# Patient Record
Sex: Female | Born: 1980 | Race: White | Hispanic: No | Marital: Married | Smoking: Never smoker
Health system: Southern US, Community
[De-identification: ages and names within clinical notes are randomized; demographics above are authoritative.]

## PROBLEM LIST (undated history)

## (undated) DIAGNOSIS — B0089 Other herpesviral infection: Secondary | ICD-10-CM

## (undated) DIAGNOSIS — E282 Polycystic ovarian syndrome: Secondary | ICD-10-CM

## (undated) DIAGNOSIS — F329 Major depressive disorder, single episode, unspecified: Secondary | ICD-10-CM

## (undated) DIAGNOSIS — F419 Anxiety disorder, unspecified: Secondary | ICD-10-CM

## (undated) DIAGNOSIS — A749 Chlamydial infection, unspecified: Secondary | ICD-10-CM

## (undated) DIAGNOSIS — K449 Diaphragmatic hernia without obstruction or gangrene: Secondary | ICD-10-CM

## (undated) DIAGNOSIS — L680 Hirsutism: Secondary | ICD-10-CM

## (undated) HISTORY — PX: COLPOSCOPY: SHX161

## (undated) HISTORY — DX: Polycystic ovarian syndrome: E28.2

## (undated) HISTORY — DX: Diaphragmatic hernia without obstruction or gangrene: K44.9

## (undated) HISTORY — PX: CHOLECYSTECTOMY, LAPAROSCOPIC: SHX56

## (undated) HISTORY — DX: Major depressive disorder, single episode, unspecified: F32.9

## (undated) HISTORY — DX: Hirsutism: L68.0

## (undated) HISTORY — DX: Anxiety disorder, unspecified: F41.9

## (undated) HISTORY — DX: Chlamydial infection, unspecified: A74.9

## (undated) HISTORY — PX: BARIATRIC SURGERY: SHX1103

## (undated) HISTORY — DX: Other herpesviral infection: B00.89

---

## 2005-04-05 ENCOUNTER — Emergency Department: Payer: Self-pay | Admitting: Emergency Medicine

## 2005-06-12 ENCOUNTER — Ambulatory Visit: Payer: Self-pay | Admitting: Family Medicine

## 2005-06-25 ENCOUNTER — Ambulatory Visit: Payer: Self-pay | Admitting: Family Medicine

## 2007-06-26 ENCOUNTER — Ambulatory Visit: Payer: Self-pay | Admitting: Surgery

## 2011-11-20 ENCOUNTER — Ambulatory Visit: Payer: Self-pay | Admitting: Nurse Practitioner

## 2011-12-09 ENCOUNTER — Ambulatory Visit: Payer: Self-pay | Admitting: Nurse Practitioner

## 2012-07-21 ENCOUNTER — Other Ambulatory Visit: Payer: Self-pay | Admitting: Bariatrics

## 2012-07-21 LAB — HEMOGLOBIN A1C: Hemoglobin A1C: 4.9 % (ref 4.2–6.3)

## 2012-07-21 LAB — FERRITIN: Ferritin (ARMC): 101 ng/mL (ref 8–388)

## 2012-07-21 LAB — MAGNESIUM: Magnesium: 1.8 mg/dL

## 2012-07-21 LAB — CBC WITH DIFFERENTIAL/PLATELET
Basophil #: 0 10*3/uL (ref 0.0–0.1)
Basophil %: 0.3 %
Eosinophil #: 0.2 10*3/uL (ref 0.0–0.7)
HCT: 38.9 % (ref 35.0–47.0)
HGB: 13.6 g/dL (ref 12.0–16.0)
Lymphocyte #: 2.4 10*3/uL (ref 1.0–3.6)
MCH: 33.6 pg (ref 26.0–34.0)
MCHC: 34.9 g/dL (ref 32.0–36.0)
MCV: 96 fL (ref 80–100)
Monocyte #: 0.3 x10 3/mm (ref 0.2–0.9)
Monocyte %: 2.9 %
Neutrophil #: 8.8 10*3/uL — ABNORMAL HIGH (ref 1.4–6.5)

## 2012-07-21 LAB — COMPREHENSIVE METABOLIC PANEL
Albumin: 3.6 g/dL (ref 3.4–5.0)
Anion Gap: 4 — ABNORMAL LOW (ref 7–16)
Bilirubin,Total: 0.4 mg/dL (ref 0.2–1.0)
Calcium, Total: 9.1 mg/dL (ref 8.5–10.1)
Chloride: 107 mmol/L (ref 98–107)
Co2: 28 mmol/L (ref 21–32)
Creatinine: 0.76 mg/dL (ref 0.60–1.30)
EGFR (African American): >60
EGFR (Non-African Amer.): >60
Glucose: 91 mg/dL (ref 65–99)
Osmolality: 275 (ref 275–301)
Potassium: 4.3 mmol/L (ref 3.5–5.1)
Sodium: 139 mmol/L (ref 136–145)

## 2012-07-21 LAB — IRON AND TIBC
Iron Bind.Cap.(Total): 351 ug/dL (ref 250–450)
Iron Saturation: 15 %
Iron: 52 ug/dL (ref 50–170)

## 2012-07-21 LAB — PHOSPHORUS: Phosphorus: 2.9 mg/dL (ref 2.5–4.9)

## 2012-07-21 LAB — APTT: Activated PTT: 28.6 secs (ref 23.6–35.9)

## 2012-07-21 LAB — PROTIME-INR: INR: 0.9

## 2012-07-21 LAB — LIPASE, BLOOD: Lipase: 125 U/L (ref 73–393)

## 2012-07-21 LAB — HEPATIC FUNCTION PANEL A (ARMC): Bilirubin, Direct: <0.1 mg/dL (ref 0.00–0.20)

## 2012-07-21 LAB — AMYLASE: Amylase: 36 U/L (ref 25–115)

## 2012-07-22 ENCOUNTER — Ambulatory Visit: Payer: Self-pay | Admitting: Bariatrics

## 2012-07-22 DIAGNOSIS — Z01810 Encounter for preprocedural cardiovascular examination: Secondary | ICD-10-CM

## 2012-08-09 ENCOUNTER — Ambulatory Visit: Payer: Self-pay | Admitting: Bariatrics

## 2012-08-14 ENCOUNTER — Ambulatory Visit: Payer: Self-pay | Admitting: Bariatrics

## 2012-12-09 DIAGNOSIS — A749 Chlamydial infection, unspecified: Secondary | ICD-10-CM

## 2012-12-09 HISTORY — DX: Chlamydial infection, unspecified: A74.9

## 2014-05-29 LAB — HM PAP SMEAR: HM Pap smear: NEGATIVE

## 2014-07-11 IMAGING — CR DG CHEST 2V
1 series · 2 of 2 positions shown · non-contrast
Comparison: none

REASON FOR EXAM: polycystic ovarian syndrom morbid obesity anxiety
COMMENTS:

[Series 1: pa · 0.17mm/px · 2 of 2 slices shown]
[im 1/2]
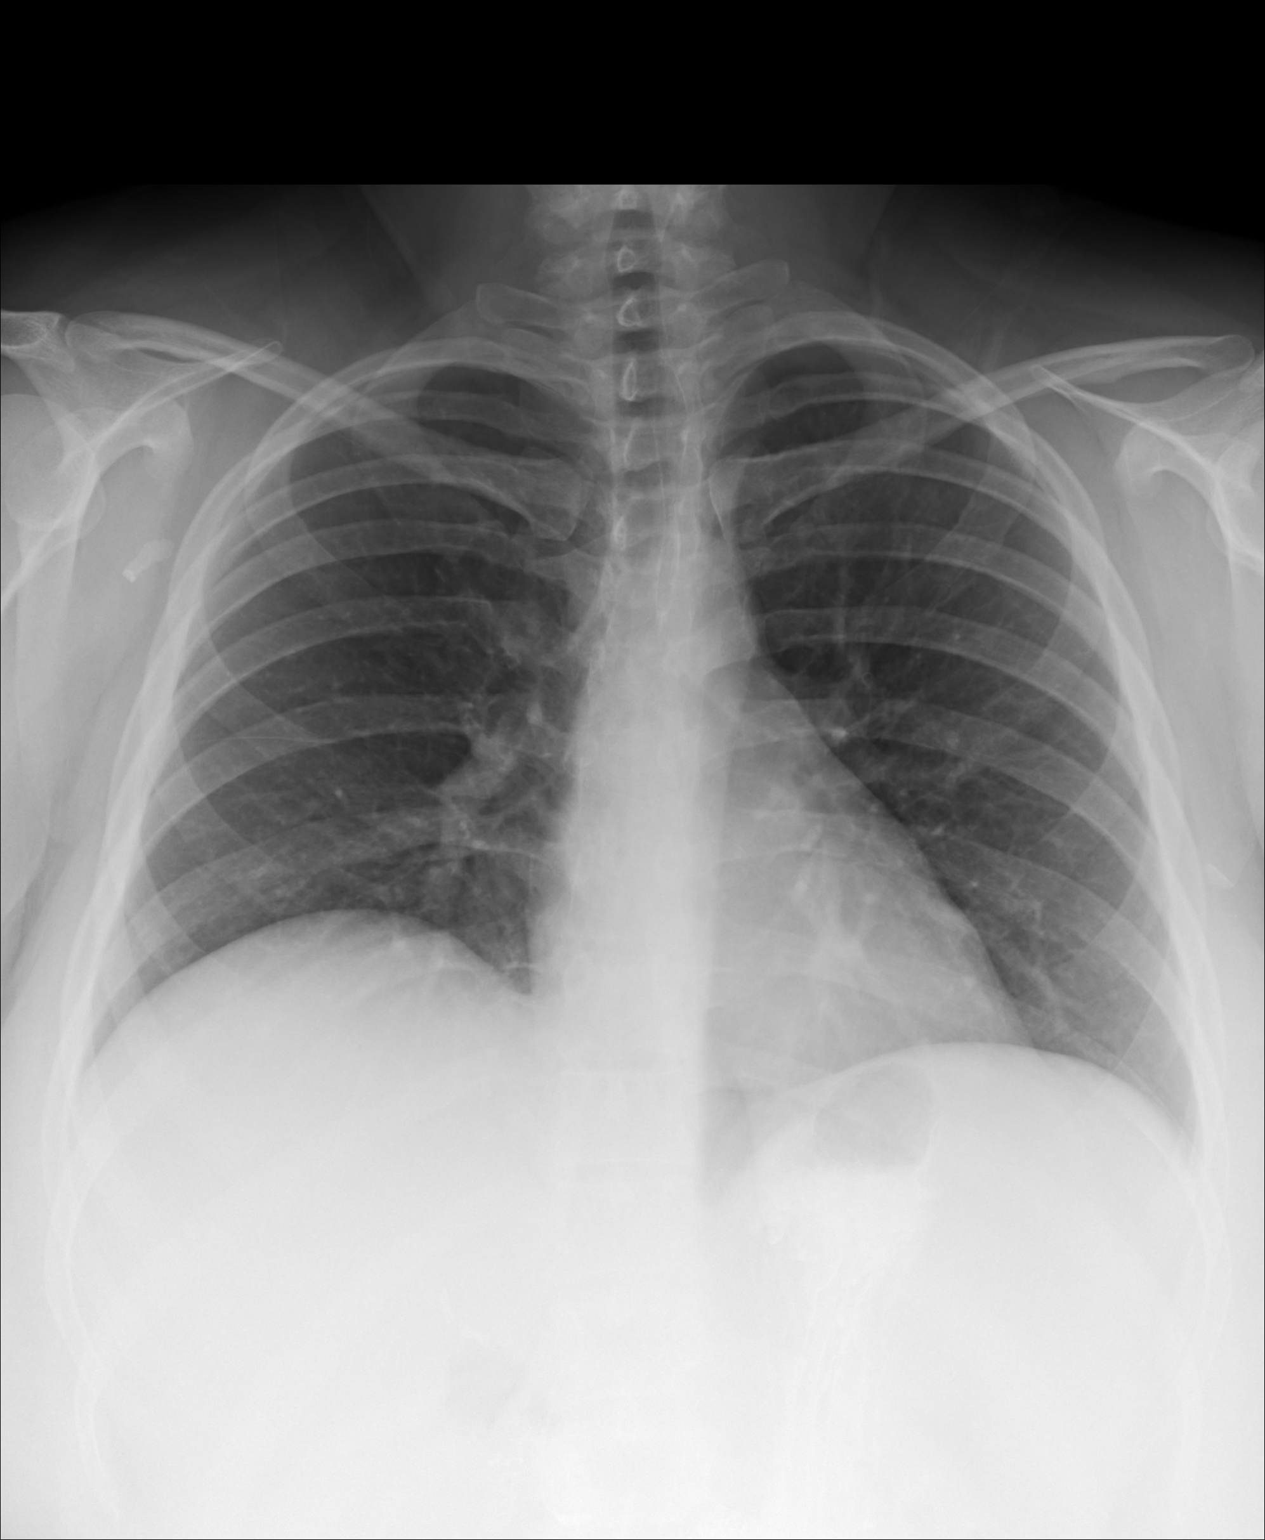
[im 2/2]
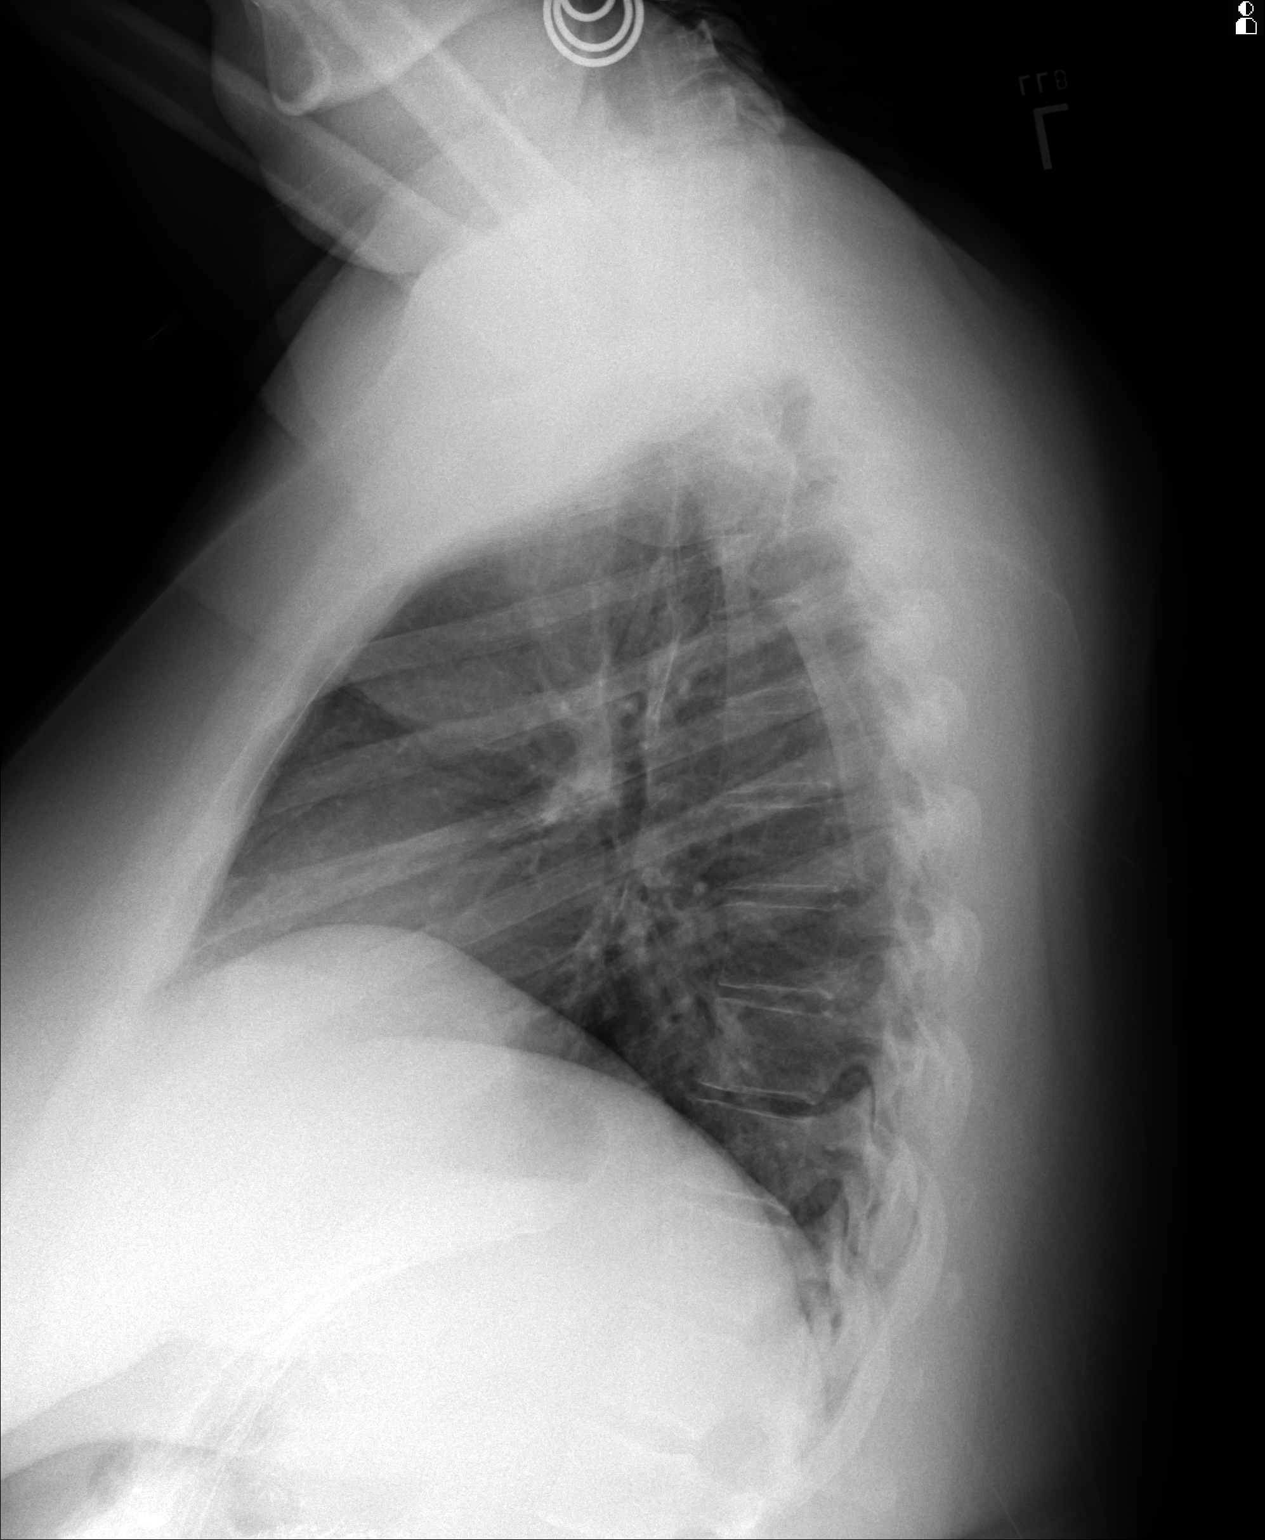

[2 of 2 positions shown; findings below may reference images not displayed]

PROCEDURE:     DXR - DXR CHEST PA (OR AP) AND LATERAL  - July 22, 2012  [DATE]

RESULT:     There is no previous exam for comparison.

The lungs are clear. The heart and pulmonary vessels are normal. The bony
and mediastinal structures are unremarkable. There is no effusion. There is
no pneumothorax or evidence of congestive failure.
IMPRESSION: No acute cardiopulmonary disease.

[REDACTED]

## 2017-09-17 ENCOUNTER — Ambulatory Visit: Payer: Self-pay | Admitting: Obstetrics and Gynecology

## 2017-10-21 ENCOUNTER — Encounter: Payer: Self-pay | Admitting: Obstetrics and Gynecology

## 2017-10-22 NOTE — Progress Notes (Signed)
This encounter was created in error - please disregard.

## 2017-11-11 ENCOUNTER — Other Ambulatory Visit: Payer: Self-pay

## 2017-11-11 ENCOUNTER — Telehealth: Payer: Self-pay

## 2017-11-11 ENCOUNTER — Other Ambulatory Visit: Payer: Self-pay | Admitting: Obstetrics and Gynecology

## 2017-11-11 MED ORDER — LEVONORGEST-ETH ESTRAD 91-DAY 0.15-0.03 MG PO TABS: 1.0000 | ORAL_TABLET | Freq: Every day | ORAL | 1 refills | Status: DC

## 2017-11-11 NOTE — Telephone Encounter (Signed)
Pt requesting refill of OCP. Her AE is 12/22/17. [phone]#[phone]

## 2017-11-11 NOTE — Telephone Encounter (Signed)
Done, pt aware.

## 2017-12-22 ENCOUNTER — Ambulatory Visit (INDEPENDENT_AMBULATORY_CARE_PROVIDER_SITE_OTHER): Payer: Commercial Managed Care - PPO | Admitting: Obstetrics and Gynecology

## 2017-12-22 ENCOUNTER — Ambulatory Visit: Payer: Self-pay | Admitting: Obstetrics and Gynecology

## 2017-12-22 ENCOUNTER — Encounter: Payer: Self-pay | Admitting: Obstetrics and Gynecology

## 2017-12-22 VITALS — BP 130/90 | HR 96 | Ht 64.0 in | Wt 190.0 lb

## 2017-12-22 DIAGNOSIS — Z01419 Encounter for gynecological examination (general) (routine) without abnormal findings: Secondary | ICD-10-CM

## 2017-12-22 DIAGNOSIS — F32A Depression, unspecified: Secondary | ICD-10-CM

## 2017-12-22 DIAGNOSIS — Z124 Encounter for screening for malignant neoplasm of cervix: Secondary | ICD-10-CM | POA: Diagnosis not present

## 2017-12-22 DIAGNOSIS — Z1329 Encounter for screening for other suspected endocrine disorder: Secondary | ICD-10-CM | POA: Diagnosis not present

## 2017-12-22 DIAGNOSIS — F419 Anxiety disorder, unspecified: Secondary | ICD-10-CM

## 2017-12-22 DIAGNOSIS — Z3041 Encounter for surveillance of contraceptive pills: Secondary | ICD-10-CM | POA: Diagnosis not present

## 2017-12-22 DIAGNOSIS — Z1321 Encounter for screening for nutritional disorder: Secondary | ICD-10-CM | POA: Diagnosis not present

## 2017-12-22 DIAGNOSIS — B0089 Other herpesviral infection: Secondary | ICD-10-CM | POA: Diagnosis not present

## 2017-12-22 DIAGNOSIS — Z113 Encounter for screening for infections with a predominantly sexual mode of transmission: Secondary | ICD-10-CM | POA: Diagnosis not present

## 2017-12-22 DIAGNOSIS — Z0000 Encounter for general adult medical examination without abnormal findings: Secondary | ICD-10-CM

## 2017-12-22 DIAGNOSIS — B009 Herpesviral infection, unspecified: Secondary | ICD-10-CM | POA: Insufficient documentation

## 2017-12-22 DIAGNOSIS — Z131 Encounter for screening for diabetes mellitus: Secondary | ICD-10-CM | POA: Diagnosis not present

## 2017-12-22 DIAGNOSIS — Z13220 Encounter for screening for lipoid disorders: Secondary | ICD-10-CM | POA: Diagnosis not present

## 2017-12-22 DIAGNOSIS — Z1151 Encounter for screening for human papillomavirus (HPV): Secondary | ICD-10-CM | POA: Diagnosis not present

## 2017-12-22 DIAGNOSIS — F329 Major depressive disorder, single episode, unspecified: Secondary | ICD-10-CM

## 2017-12-22 MED ORDER — LEVONORGEST-ETH ESTRAD 91-DAY 0.15-0.03 &0.01 MG PO TABS: 1.0000 | ORAL_TABLET | Freq: Every day | ORAL | 3 refills | Status: DC

## 2017-12-22 MED ORDER — VALACYCLOVIR HCL 1 G PO TABS: 500.0000 mg | ORAL_TABLET | Freq: Two times a day (BID) | ORAL | 0 refills | Status: DC

## 2017-12-22 NOTE — Progress Notes (Signed)
PCP:  Patient, No Pcp Per   Chief Complaint  Patient presents with  . Gynecologic Exam    std testing c blood work; ? time for pap     HPI:      [name] is a 37 y.o. No obstetric history on file. who LMP was Patient's last menstrual period was 11/27/2017., presents today for her annual examination.  Her menses are Q3 months on OCPs, lasting 5 days.  Dysmenorrhea none. She does not have intermenstrual bleeding.  Sex activity: not sexually active. Was active about 8 months ago. Wants full STD panel.  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  There is no FH of breast cancer. There is no FH of ovarian cancer. The patient does do self-breast exams.  Tobacco use: The patient denies current or previous tobacco use. Alcohol use: none No drug use.  Exercise: not active  She does get adequate calcium and Vitamin D in her diet.  Had normal labs 9/17. Due again this yr. S/p bariatric surgery 2013.  Hx of anxiety/depression. Seeing psych and therapist now due to family trauma.    Past Medical History:  Diagnosis Date  . Anxiety   . Chlamydia 2014  . Female hirsutism   . 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 Major depression   . Polycystic ovaries     Past Surgical History:  Procedure Laterality Date  . BARIATRIC SURGERY    . CHOLECYSTECTOMY, LAPAROSCOPIC    . COLPOSCOPY      Family History  Problem Relation Age of Onset  . Diabetes Mother   . Hypertension Mother   . Uterine cancer Mother   . Heart disease Father   . Diabetes Father   . Hypertension Father     Social History   Socioeconomic History  . Marital status: Single    Spouse name: Not on file  . Number of children: Not on file  . Years of education: Not on file  . Highest education level: Not on file  Social Needs  . Financial resource strain: Not on  file  . Food insecurity - worry: Not on file  . Food insecurity - inability: Not on file  . Transportation needs - medical: Not on file  . Transportation needs - non-medical: Not on file  Occupational History  . Not on file  Tobacco Use  . Smoking status: Never Smoker  . Smokeless tobacco: Never Used  Substance and Sexual Activity  . Alcohol use: No    Frequency: Never  . Drug use: No  . Sexual activity: Yes    Birth control/protection: Pill  Other Topics Concern  . Not on file  Social History Narrative  . Not on file    Current Meds  Medication Sig  . Biotin 5000 MCG SUBL Place 2 tablets under the tongue daily.  Marland Kitchen. buPROPion (WELLBUTRIN XL) 150 MG 24 hr tablet Take 1 tablet by mouth daily.  Marland Kitchen. buPROPion (WELLBUTRIN XL) 300 MG 24 hr tablet Take 1 tablet by mouth daily.  . cetirizine (ZYRTEC) 10 MG tablet Take 1 tablet by mouth daily.  Marland Kitchen. desvenlafaxine (PRISTIQ) 100 MG 24 hr tablet Take 100 mg by mouth daily.  . Levonorgestrel-Ethinyl Estradiol (ASHLYNA) 0.15-0.03 &0.01 MG tablet Take 1 tablet by mouth daily.  Marland Kitchen. LORazepam (ATIVAN) 0.5 MG tablet  Take 1 tablet by mouth daily as needed.  . Multiple Vitamins-Minerals (MULTIVITAMIN ADULT EXTRA C PO) Take 1 tablet by mouth daily.  . valACYclovir (VALTREX) 1000 MG tablet Take 0.5 tablets (500 mg total) by mouth 2 (two) times daily. X 3 days prn sx  . [DISCONTINUED] Levonorgestrel-Ethinyl Estradiol (ASHLYNA) 0.15-0.03 &0.01 MG tablet Take 1 tablet by mouth daily.  . [DISCONTINUED] valACYclovir (VALTREX) 1000 MG tablet Take 1,000 mg by mouth 3 (three) times daily as needed.     ROS:  Review of Systems  Constitutional: Positive for fatigue. Negative for fever and unexpected weight change.  Respiratory: Negative for cough, shortness of breath and wheezing.   Cardiovascular: Negative for chest pain, palpitations and leg swelling.  Gastrointestinal: Negative for blood in stool, constipation, diarrhea, nausea and vomiting.  Endocrine:  Negative for cold intolerance, heat intolerance and polyuria.  Genitourinary: Negative for dyspareunia, dysuria, flank pain, frequency, genital sores, hematuria, menstrual problem, pelvic pain, urgency, vaginal bleeding, vaginal discharge and vaginal pain.  Musculoskeletal: Negative for back pain, joint swelling and myalgias.  Skin: Negative for rash.  Neurological: Negative for dizziness, syncope, light-headedness, numbness and headaches.  Hematological: Negative for adenopathy.  Psychiatric/Behavioral: Positive for dysphoric mood. Negative for agitation, confusion, sleep disturbance and suicidal ideas. The patient is not nervous/anxious.      Objective: BP 130/90   Pulse 96   Ht 5\' 4"  (1.626 m)   Wt 190 lb (86.2 kg)   LMP 11/27/2017   BMI 32.61 kg/m    Physical Exam  Constitutional: She is oriented to person, place, and time. She appears well-developed and well-nourished.  Genitourinary: Vagina normal and uterus normal. There is no rash or tenderness on the right labia. There is no rash or tenderness on the left labia. No erythema or tenderness in the vagina. No vaginal discharge found. Right adnexum does not display mass and does not display tenderness. Left adnexum does not display mass and does not display tenderness. Cervix does not exhibit motion tenderness or polyp. Uterus is not enlarged or tender.  Neck: Normal range of motion. No thyromegaly present.  Cardiovascular: Normal rate, regular rhythm and normal heart sounds.  No murmur heard. Pulmonary/Chest: Effort normal and breath sounds normal. Right breast exhibits no mass, no nipple discharge, no skin change and no tenderness. Left breast exhibits no mass, no nipple discharge, no skin change and no tenderness.  Abdominal: Soft. There is no tenderness. There is no guarding.  Musculoskeletal: Normal range of motion.  Neurological: She is alert and oriented to person, place, and time. No cranial nerve deficit.  Psychiatric: She  has a normal mood and affect. Her behavior is normal.  Vitals reviewed.  Assessment/Plan: Encounter for annual routine gynecological examination  Cervical cancer screening - Plan: IGP,CtNgTv,Apt HPV,rfx16/18,45, CANCELED: IGP, Aptima HPV  Screening for HPV (human papillomavirus) - Plan: IGP,CtNgTv,Apt HPV,rfx16/18,45, CANCELED: IGP, Aptima HPV  Screening for STD (sexually transmitted disease) - Plan: RPR, HIV antibody, Hepatitis C antibody, IGP,CtNgTv,Apt HPV,rfx16/18,45  Encounter for surveillance of contraceptive pills - OCP RF. - Plan: Levonorgestrel-Ethinyl Estradiol (ASHLYNA) 0.15-0.03 &0.01 MG tablet  Anxiety and depression - Followed by psych now. Seeing therapist, too.  Herpetic whitlow - On hands. Rx RF valtrex prn. - Plan: valACYclovir (VALTREX) 1000 MG tablet  Blood tests for routine general physical examination - Plan: Comprehensive metabolic panel, CBC with Differential/Platelet, B12 and Folate Panel, TSH, VITAMIN D 25 Hydroxy (Vit-D Deficiency, Fractures), Lipid panel, Hemoglobin A1c  Encounter for vitamin deficiency screening - Plan: B12 and Folate  Panel, VITAMIN D 25 Hydroxy (Vit-D Deficiency, Fractures)  Screening cholesterol level - Plan: Lipid panel  Thyroid disorder screening - Plan: TSH  Screening for diabetes mellitus - Plan: Hemoglobin A1c  Meds ordered this encounter  Medications  . valACYclovir (VALTREX) 1000 MG tablet    Sig: Take 0.5 tablets (500 mg total) by mouth 2 (two) times daily. X 3 days prn sx    Dispense:  30 tablet    Refill:  0  . Levonorgestrel-Ethinyl Estradiol (ASHLYNA) 0.15-0.03 &0.01 MG tablet    Sig: Take 1 tablet by mouth daily.    Dispense:  1 Package    Refill:  3             GYN counsel breast self exam, adequate intake of calcium and vitamin D, diet and exercise     F/U  Return in about 1 year (around 12/22/2018).  Tahisha Hakim B. Tearra Ouk, PA-C 12/22/2017 9:42 AM

## 2017-12-22 NOTE — Patient Instructions (Signed)
I value your feedback and entrusting us with your care. If you get a [facility] patient survey, I would appreciate you taking the time to let us know about your experience today. Thank you! 

## 2017-12-23 LAB — COMPREHENSIVE METABOLIC PANEL
ALBUMIN: 4.2 g/dL (ref 3.5–5.5)
ALK PHOS: 137 IU/L — AB (ref 39–117)
ALT: 34 IU/L — AB (ref 0–32)
AST: 30 IU/L (ref 0–40)
Albumin/Globulin Ratio: 1.4 (ref 1.2–2.2)
BUN / CREAT RATIO: 7 — AB (ref 9–23)
BUN: 7 mg/dL (ref 6–20)
Bilirubin Total: 0.4 mg/dL (ref 0.0–1.2)
CHLORIDE: 101 mmol/L (ref 96–106)
CO2: 24 mmol/L (ref 20–29)
CREATININE: 0.95 mg/dL (ref 0.57–1.00)
Calcium: 9.5 mg/dL (ref 8.7–10.2)
GFR calc Af Amer: 90 mL/min/{1.73_m2} (ref >59)
GFR calc non Af Amer: 78 mL/min/{1.73_m2} (ref >59)
GLUCOSE: 85 mg/dL (ref 65–99)
Globulin, Total: 3 g/dL (ref 1.5–4.5)
Potassium: 4 mmol/L (ref 3.5–5.2)
Sodium: 141 mmol/L (ref 134–144)
TOTAL PROTEIN: 7.2 g/dL (ref 6.0–8.5)

## 2017-12-23 LAB — CBC WITH DIFFERENTIAL/PLATELET
BASOS ABS: 0 10*3/uL (ref 0.0–0.2)
Basos: 0 %
EOS (ABSOLUTE): 0.2 10*3/uL (ref 0.0–0.4)
Eos: 2 %
HEMATOCRIT: 45.2 % (ref 34.0–46.6)
HEMOGLOBIN: 15.4 g/dL (ref 11.1–15.9)
IMMATURE GRANS (ABS): 0 10*3/uL (ref 0.0–0.1)
IMMATURE GRANULOCYTES: 0 %
LYMPHS ABS: 2 10*3/uL (ref 0.7–3.1)
LYMPHS: 21 %
MCH: 33.8 pg — ABNORMAL HIGH (ref 26.6–33.0)
MCHC: 34.1 g/dL (ref 31.5–35.7)
MCV: 99 fL — ABNORMAL HIGH (ref 79–97)
MONOCYTES: 4 %
Monocytes Absolute: 0.4 10*3/uL (ref 0.1–0.9)
Neutrophils Absolute: 7 10*3/uL (ref 1.4–7.0)
Neutrophils: 73 %
Platelets: 323 10*3/uL (ref 150–379)
RBC: 4.55 x10E6/uL (ref 3.77–5.28)
RDW: 13.3 % (ref 12.3–15.4)
WBC: 9.6 10*3/uL (ref 3.4–10.8)

## 2017-12-23 LAB — TSH: TSH: 1.41 u[IU]/mL (ref 0.450–4.500)

## 2017-12-23 LAB — HEMOGLOBIN A1C
ESTIMATED AVERAGE GLUCOSE: 91 mg/dL
Hgb A1c MFr Bld: 4.8 % (ref 4.8–5.6)

## 2017-12-23 LAB — B12 AND FOLATE PANEL: VITAMIN B 12: 678 pg/mL (ref 232–1245)

## 2017-12-23 LAB — HIV ANTIBODY (ROUTINE TESTING W REFLEX): HIV SCREEN 4TH GENERATION: NONREACTIVE

## 2017-12-23 LAB — LIPID PANEL
CHOL/HDL RATIO: 3.3 ratio (ref 0.0–4.4)
Cholesterol, Total: 182 mg/dL (ref 100–199)
HDL: 55 mg/dL (ref >39)
LDL CALC: 104 mg/dL — AB (ref 0–99)
TRIGLYCERIDES: 113 mg/dL (ref 0–149)
VLDL Cholesterol Cal: 23 mg/dL (ref 5–40)

## 2017-12-23 LAB — HEPATITIS C ANTIBODY: Hep C Virus Ab: <0.1 s/co ratio (ref 0.0–0.9)

## 2017-12-23 LAB — RPR: RPR: NONREACTIVE

## 2017-12-23 LAB — VITAMIN D 25 HYDROXY (VIT D DEFICIENCY, FRACTURES): VIT D 25 HYDROXY: 55.1 ng/mL (ref 30.0–100.0)

## 2017-12-26 LAB — IGP,CTNGTV,APT HPV,RFX16/18,45
Chlamydia, Nuc. Acid Amp: NEGATIVE
Gonococcus, Nuc. Acid Amp: NEGATIVE
HPV Aptima: POSITIVE — AB
HPV GENOTYPE 18,45: NEGATIVE
HPV Genotype 16: NEGATIVE
PAP SMEAR COMMENT: 0
TRICH VAG BY NAA: NEGATIVE

## 2017-12-31 ENCOUNTER — Telehealth: Payer: Self-pay | Admitting: Obstetrics and Gynecology

## 2017-12-31 NOTE — Telephone Encounter (Signed)
LMTRC

## 2017-12-31 NOTE — Telephone Encounter (Signed)
Pt returning your phone call

## 2018-08-17 ENCOUNTER — Telehealth: Payer: Self-pay

## 2018-08-17 ENCOUNTER — Other Ambulatory Visit: Payer: Self-pay

## 2018-08-17 DIAGNOSIS — Z3041 Encounter for surveillance of contraceptive pills: Secondary | ICD-10-CM

## 2018-08-17 MED ORDER — LEVONORGEST-ETH ESTRAD 91-DAY 0.15-0.03 &0.01 MG PO TABS: 1.0000 | ORAL_TABLET | Freq: Every day | ORAL | 3 refills | Status: DC

## 2018-08-17 NOTE — Telephone Encounter (Signed)
Pt calling stating there was a problem with [facility] transferring her 90 day supply of OCP to [facility]. Pt would like [facility] to send in refill to [facility].   I updated her pharmacy in chart. KJ CMA

## 2018-10-02 DIAGNOSIS — F331 Major depressive disorder, recurrent, moderate: Secondary | ICD-10-CM | POA: Diagnosis not present

## 2018-10-02 DIAGNOSIS — F411 Generalized anxiety disorder: Secondary | ICD-10-CM | POA: Diagnosis not present

## 2018-10-26 DIAGNOSIS — M542 Cervicalgia: Secondary | ICD-10-CM | POA: Diagnosis not present

## 2018-10-26 DIAGNOSIS — S4992XA Unspecified injury of left shoulder and upper arm, initial encounter: Secondary | ICD-10-CM | POA: Diagnosis not present

## 2018-11-19 DIAGNOSIS — F411 Generalized anxiety disorder: Secondary | ICD-10-CM | POA: Diagnosis not present

## 2018-11-19 DIAGNOSIS — F331 Major depressive disorder, recurrent, moderate: Secondary | ICD-10-CM | POA: Diagnosis not present

## 2018-11-27 DIAGNOSIS — R21 Rash and other nonspecific skin eruption: Secondary | ICD-10-CM | POA: Diagnosis not present

## 2018-11-27 DIAGNOSIS — I891 Lymphangitis: Secondary | ICD-10-CM | POA: Diagnosis not present

## 2018-12-11 DIAGNOSIS — F331 Major depressive disorder, recurrent, moderate: Secondary | ICD-10-CM | POA: Diagnosis not present

## 2018-12-11 DIAGNOSIS — F411 Generalized anxiety disorder: Secondary | ICD-10-CM | POA: Diagnosis not present

## 2018-12-23 DIAGNOSIS — F411 Generalized anxiety disorder: Secondary | ICD-10-CM | POA: Diagnosis not present

## 2018-12-23 DIAGNOSIS — F331 Major depressive disorder, recurrent, moderate: Secondary | ICD-10-CM | POA: Diagnosis not present

## 2019-01-05 DIAGNOSIS — F331 Major depressive disorder, recurrent, moderate: Secondary | ICD-10-CM | POA: Diagnosis not present

## 2019-01-05 DIAGNOSIS — F411 Generalized anxiety disorder: Secondary | ICD-10-CM | POA: Diagnosis not present

## 2019-01-15 DIAGNOSIS — F331 Major depressive disorder, recurrent, moderate: Secondary | ICD-10-CM | POA: Diagnosis not present

## 2019-01-15 DIAGNOSIS — F411 Generalized anxiety disorder: Secondary | ICD-10-CM | POA: Diagnosis not present

## 2019-01-20 DIAGNOSIS — Z9884 Bariatric surgery status: Secondary | ICD-10-CM | POA: Diagnosis not present

## 2019-01-20 DIAGNOSIS — R635 Abnormal weight gain: Secondary | ICD-10-CM | POA: Diagnosis not present

## 2019-01-20 DIAGNOSIS — Z6837 Body mass index (BMI) 37.0-37.9, adult: Secondary | ICD-10-CM | POA: Diagnosis not present

## 2019-02-04 DIAGNOSIS — F411 Generalized anxiety disorder: Secondary | ICD-10-CM | POA: Diagnosis not present

## 2019-02-04 DIAGNOSIS — F331 Major depressive disorder, recurrent, moderate: Secondary | ICD-10-CM | POA: Diagnosis not present

## 2019-02-04 DIAGNOSIS — F410 Panic disorder [episodic paroxysmal anxiety] without agoraphobia: Secondary | ICD-10-CM | POA: Diagnosis not present

## 2019-02-16 ENCOUNTER — Ambulatory Visit (INDEPENDENT_AMBULATORY_CARE_PROVIDER_SITE_OTHER): Payer: BLUE CROSS/BLUE SHIELD | Admitting: Obstetrics and Gynecology

## 2019-02-16 ENCOUNTER — Encounter: Payer: Self-pay | Admitting: Obstetrics and Gynecology

## 2019-02-16 ENCOUNTER — Other Ambulatory Visit (HOSPITAL_COMMUNITY)
Admission: RE | Admit: 2019-02-16 | Discharge: 2019-02-16 | Disposition: A | Discharge status: Home / Self Care | Payer: BLUE CROSS/BLUE SHIELD | Source: Ambulatory Visit | Attending: Obstetrics and Gynecology | Admitting: Obstetrics and Gynecology

## 2019-02-16 VITALS — BP 106/80 | HR 105 | Ht 64.0 in | Wt 218.0 lb

## 2019-02-16 DIAGNOSIS — Z124 Encounter for screening for malignant neoplasm of cervix: Secondary | ICD-10-CM | POA: Diagnosis not present

## 2019-02-16 DIAGNOSIS — Z0000 Encounter for general adult medical examination without abnormal findings: Secondary | ICD-10-CM

## 2019-02-16 DIAGNOSIS — Z01419 Encounter for gynecological examination (general) (routine) without abnormal findings: Secondary | ICD-10-CM | POA: Diagnosis not present

## 2019-02-16 DIAGNOSIS — Z13220 Encounter for screening for lipoid disorders: Secondary | ICD-10-CM

## 2019-02-16 DIAGNOSIS — Z1151 Encounter for screening for human papillomavirus (HPV): Secondary | ICD-10-CM

## 2019-02-16 DIAGNOSIS — R87810 Cervical high risk human papillomavirus (HPV) DNA test positive: Secondary | ICD-10-CM | POA: Diagnosis not present

## 2019-02-16 DIAGNOSIS — Z3041 Encounter for surveillance of contraceptive pills: Secondary | ICD-10-CM

## 2019-02-16 DIAGNOSIS — F419 Anxiety disorder, unspecified: Secondary | ICD-10-CM

## 2019-02-16 DIAGNOSIS — F32A Depression, unspecified: Secondary | ICD-10-CM

## 2019-02-16 DIAGNOSIS — F329 Major depressive disorder, single episode, unspecified: Secondary | ICD-10-CM

## 2019-02-16 DIAGNOSIS — Z9884 Bariatric surgery status: Secondary | ICD-10-CM

## 2019-02-16 DIAGNOSIS — Z113 Encounter for screening for infections with a predominantly sexual mode of transmission: Secondary | ICD-10-CM

## 2019-02-16 DIAGNOSIS — Z1321 Encounter for screening for nutritional disorder: Secondary | ICD-10-CM

## 2019-02-16 DIAGNOSIS — Z1329 Encounter for screening for other suspected endocrine disorder: Secondary | ICD-10-CM

## 2019-02-16 DIAGNOSIS — B0089 Other herpesviral infection: Secondary | ICD-10-CM

## 2019-02-16 DIAGNOSIS — Z3169 Encounter for other general counseling and advice on procreation: Secondary | ICD-10-CM

## 2019-02-16 DIAGNOSIS — Z131 Encounter for screening for diabetes mellitus: Secondary | ICD-10-CM

## 2019-02-16 MED ORDER — VALACYCLOVIR HCL 1 G PO TABS
500.0000 mg | ORAL_TABLET | Freq: Two times a day (BID) | ORAL | 0 refills | Status: DC
Start: 2019-02-16 — End: 2020-04-24

## 2019-02-16 MED ORDER — LEVONORGEST-ETH ESTRAD 91-DAY 0.15-0.03 &0.01 MG PO TABS
1.0000 | ORAL_TABLET | Freq: Every day | ORAL | 4 refills | Status: DC
Start: 2019-02-16 — End: 2020-04-04

## 2019-02-16 NOTE — Progress Notes (Signed)
PCP:  Patient, No Pcp Per   Chief Complaint  Patient presents with  . Gynecologic Exam     HPI:      [name] is a 38 y.o. No obstetric history on file. who LMP was Patient's last menstrual period was 11/26/2018 (exact date)., presents today for her annual examination.  Her menses are Q3 months on cont dosing OCPs, lasting 4-5 days.  Dysmenorrhea none. She does not have intermenstrual bleeding. Hx of PCOS with irreg menses prior to OCPs. Was also heavier at that time. Pt wants to conceive after wedding in a couple months. Takes multiple vits due to s/p bariatric surg.  Sex activity: currently sexually active. Contraception: OCPs. Last Pap: 12/22/17 Results were: no abnormalities /POS HPV DNA. Repeat pap due today.  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  There is no FH of breast cancer. There is no FH of ovarian cancer. The patient does do self-breast exams.  Tobacco use: The patient denies current or previous tobacco use. Alcohol use: none No drug use.  Exercise: min active  She does get adequate calcium and Vitamin D in her diet.  Had normal labs 1/19. Due again this yr. S/p bariatric surgery 2013.  Hx of anxiety/depression. Seeing psych and therapist now due to family trauma.    Past Medical History:  Diagnosis Date  . Anxiety   . Chlamydia 2014  . Female hirsutism   . 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 Major depression   . Polycystic ovaries     Past Surgical History:  Procedure Laterality Date  . BARIATRIC SURGERY    . CHOLECYSTECTOMY, LAPAROSCOPIC    . COLPOSCOPY      Family History  Problem Relation Age of Onset  . Diabetes Mother        type 2  . Hypertension Mother   . Uterine cancer Mother   . Heart disease Father   . Diabetes Father        type 2  . Hypertension Father   . Colon cancer Maternal Grandmother     Social History   Socioeconomic History  .  Marital status: Single    Spouse name: Not on file  . Number of children: Not on file  . Years of education: Not on file  . Highest education level: Not on file  Occupational History  . Not on file  Social Needs  . Financial resource strain: Not on file  . Food insecurity:    Worry: Not on file    Inability: Not on file  . Transportation needs:    Medical: Not on file    Non-medical: Not on file  Tobacco Use  . Smoking status: Never Smoker  . Smokeless tobacco: Never Used  Substance and Sexual Activity  . Alcohol use: No    Frequency: Never  . Drug use: No  . Sexual activity: Yes    Birth control/protection: Pill  Lifestyle  . Physical activity:    Days per week: Not on file    Minutes per session: Not on file  . Stress: Not on file  Relationships  . Social connections:    Talks on phone: Not on file    Gets together: Not on file    Attends religious service: Not on file    Active member of club or organization: Not on file    Attends meetings of clubs  or organizations: Not on file    Relationship status: Not on file  . Intimate partner violence:    Fear of current or ex partner: Not on file    Emotionally abused: Not on file    Physically abused: Not on file    Forced sexual activity: Not on file  Other Topics Concern  . Not on file  Social History Narrative  . Not on file    Current Meds  Medication Sig  . buPROPion (WELLBUTRIN XL) 300 MG 24 hr tablet Take 1 tablet by mouth daily.  . cetirizine (ZYRTEC) 10 MG tablet Take 1 tablet by mouth daily.  Marland Kitchen desvenlafaxine (PRISTIQ) 100 MG 24 hr tablet Take 100 mg by mouth daily.  . Levonorgestrel-Ethinyl Estradiol (ASHLYNA) 0.15-0.03 &0.01 MG tablet Take 1 tablet by mouth daily.  Marland Kitchen LORazepam (ATIVAN) 1 MG tablet Take 1/2  to 1 tablet twice daily as needed for anxiety  . Multiple Vitamins-Minerals (MULTIVITAMIN ADULT EXTRA C PO) Take 1 tablet by mouth daily.  . phentermine (ADIPEX-P) 37.5 MG tablet TAKE ONE HALF TABLET  BY MOUTH IN THE MORNING  . valACYclovir (VALTREX) 1000 MG tablet Take 0.5 tablets (500 mg total) by mouth 2 (two) times daily. X 3 days prn sx  . [DISCONTINUED] Levonorgestrel-Ethinyl Estradiol (ASHLYNA) 0.15-0.03 &0.01 MG tablet Take 1 tablet by mouth daily.  . [DISCONTINUED] valACYclovir (VALTREX) 1000 MG tablet Take 0.5 tablets (500 mg total) by mouth 2 (two) times daily. X 3 days prn sx     ROS:  Review of Systems  Constitutional: Negative for fatigue, fever and unexpected weight change.  Respiratory: Negative for cough, shortness of breath and wheezing.   Cardiovascular: Negative for chest pain, palpitations and leg swelling.  Gastrointestinal: Negative for blood in stool, constipation, diarrhea, nausea and vomiting.  Endocrine: Negative for cold intolerance, heat intolerance and polyuria.  Genitourinary: Negative for dyspareunia, dysuria, flank pain, frequency, genital sores, hematuria, menstrual problem, pelvic pain, urgency, vaginal bleeding, vaginal discharge and vaginal pain.  Musculoskeletal: Negative for back pain, joint swelling and myalgias.  Skin: Negative for rash.  Neurological: Negative for dizziness, syncope, light-headedness, numbness and headaches.  Hematological: Negative for adenopathy.  Psychiatric/Behavioral: Positive for agitation and dysphoric mood. Negative for confusion, sleep disturbance and suicidal ideas. The patient is not nervous/anxious.      Objective: BP 106/80   Pulse (!) 105   Ht 5\' 4"  (1.626 m)   Wt 218 lb (98.9 kg)   LMP 11/26/2018 (Exact Date)   BMI 37.42 kg/m    Physical Exam Constitutional:      Appearance: She is well-developed.  Genitourinary:     Vulva, vagina, uterus, right adnexa and left adnexa normal.     No vulval lesion or tenderness noted.     No vaginal discharge, erythema or tenderness.     No cervical motion tenderness or polyp.     Uterus is not enlarged or tender.     No right or left adnexal mass present.      Right adnexa not tender.     Left adnexa not tender.  Neck:     Musculoskeletal: Normal range of motion.     Thyroid: No thyromegaly.  Cardiovascular:     Rate and Rhythm: Normal rate and regular rhythm.     Heart sounds: Normal heart sounds. No murmur.  Pulmonary:     Effort: Pulmonary effort is normal.     Breath sounds: Normal breath sounds.  Chest:     Breasts:  Right: No mass, nipple discharge, skin change or tenderness.        Left: No mass, nipple discharge, skin change or tenderness.  Abdominal:     Palpations: Abdomen is soft.     Tenderness: There is no abdominal tenderness. There is no guarding.  Musculoskeletal: Normal range of motion.  Neurological:     General: No focal deficit present.     Mental Status: She is alert and oriented to person, place, and time.     Cranial Nerves: No cranial nerve deficit.  Skin:    General: Skin is warm and dry.  Psychiatric:        Mood and Affect: Mood normal.        Behavior: Behavior normal.        Thought Content: Thought content normal.        Judgment: Judgment normal.  Vitals signs reviewed.    Assessment/Plan: Encounter for annual routine gynecological examination  Cervical cancer screening - Plan: Cytology - PAP  Screening for HPV (human papillomavirus) - Plan: Cytology - PAP  Cervical high risk human papillomavirus (HPV) DNA test positive - Repeat pap today. Will call pt with results.  - Plan: Cytology - PAP  Encounter for surveillance of contraceptive pills - OCP RF - Plan: Levonorgestrel-Ethinyl Estradiol (ASHLYNA) 0.15-0.03 &0.01 MG tablet  Pre-conception counseling - D/C OCPs. Hx of PCOS. Follow cycles for 6 months. F/u if not regular or no conception. Cont vits. Need to change depression meds, d/c phentermine.  Herpetic whitlow - On hands. Rx RF valtrex prn. - Plan: valACYclovir (VALTREX) 1000 MG tablet  Anxiety and depression - Followed by psych. Meds need changed for conception.   Blood tests for  routine general physical examination - Plan: Comprehensive metabolic panel, CBC with Differential/Platelet, B12 and Folate Panel, TSH + free T4, VITAMIN D 25 Hydroxy (Vit-D Deficiency, Fractures), Lipid panel, Hemoglobin A1c  Encounter for vitamin deficiency screening - Plan: B12 and Folate Panel, VITAMIN D 25 Hydroxy (Vit-D Deficiency, Fractures)  Screening cholesterol level - Plan: Lipid panel  Thyroid disorder screening - Plan: TSH + free T4  Screening for diabetes mellitus - Plan: Hemoglobin A1c  H/O bariatric surgery - Plan: Comprehensive metabolic panel, CBC with Differential/Platelet, B12 and Folate Panel, TSH + free T4, VITAMIN D 25 Hydroxy (Vit-D Deficiency, Fractures), Lipid panel, Hemoglobin A1c  Meds ordered this encounter  Medications  . Levonorgestrel-Ethinyl Estradiol (ASHLYNA) 0.15-0.03 &0.01 MG tablet    Sig: Take 1 tablet by mouth daily.    Dispense:  3 Package    Refill:  4    Order Specific Question:   Supervising Provider    Answer:   Nadara Mustard [id number]  . valACYclovir (VALTREX) 1000 MG tablet    Sig: Take 0.5 tablets (500 mg total) by mouth 2 (two) times daily. X 3 days prn sx    Dispense:  30 tablet    Refill:  0    Order Specific Question:   Supervising Provider    Answer:   Nadara Mustard [[id number]]             GYN counsel breast self exam, adequate intake of calcium and vitamin D, diet and exercise     F/U  Return in about 1 year (around 02/16/2020).  Davan Hark B. Cheyenne Bordeaux, PA-C 02/16/2019 3:57 PM

## 2019-02-17 DIAGNOSIS — Z6836 Body mass index (BMI) 36.0-36.9, adult: Secondary | ICD-10-CM | POA: Diagnosis not present

## 2019-02-17 DIAGNOSIS — Z9884 Bariatric surgery status: Secondary | ICD-10-CM | POA: Diagnosis not present

## 2019-02-18 ENCOUNTER — Other Ambulatory Visit: Payer: BLUE CROSS/BLUE SHIELD

## 2019-02-18 DIAGNOSIS — Z113 Encounter for screening for infections with a predominantly sexual mode of transmission: Secondary | ICD-10-CM

## 2019-02-18 DIAGNOSIS — Z13220 Encounter for screening for lipoid disorders: Secondary | ICD-10-CM

## 2019-02-18 DIAGNOSIS — Z131 Encounter for screening for diabetes mellitus: Secondary | ICD-10-CM | POA: Diagnosis not present

## 2019-02-18 DIAGNOSIS — Z0000 Encounter for general adult medical examination without abnormal findings: Secondary | ICD-10-CM

## 2019-02-18 DIAGNOSIS — Z1329 Encounter for screening for other suspected endocrine disorder: Secondary | ICD-10-CM

## 2019-02-18 DIAGNOSIS — Z1321 Encounter for screening for nutritional disorder: Secondary | ICD-10-CM

## 2019-02-18 DIAGNOSIS — Z9884 Bariatric surgery status: Secondary | ICD-10-CM | POA: Diagnosis not present

## 2019-02-18 LAB — CYTOLOGY - PAP
DIAGNOSIS: NEGATIVE
HPV: DETECTED — AB

## 2019-02-18 NOTE — Addendum Note (Signed)
Addended by: Althea Grimmer B on: 02/18/2019 11:18 AM   Modules accepted: Orders

## 2019-02-19 LAB — CBC WITH DIFFERENTIAL/PLATELET
Basophils Absolute: 0 10*3/uL (ref 0.0–0.2)
Basos: 0 %
EOS (ABSOLUTE): 0.2 10*3/uL (ref 0.0–0.4)
Eos: 2 %
Hematocrit: 43.4 % (ref 34.0–46.6)
Hemoglobin: 14.5 g/dL (ref 11.1–15.9)
Immature Grans (Abs): 0 10*3/uL (ref 0.0–0.1)
Immature Granulocytes: 0 %
Lymphocytes Absolute: 3 10*3/uL (ref 0.7–3.1)
Lymphs: 32 %
MCH: 33 pg (ref 26.6–33.0)
MCHC: 33.4 g/dL (ref 31.5–35.7)
MCV: 99 fL — ABNORMAL HIGH (ref 79–97)
MONOS ABS: 0.4 10*3/uL (ref 0.1–0.9)
Monocytes: 4 %
NEUTROS PCT: 62 %
Neutrophils Absolute: 6 10*3/uL (ref 1.4–7.0)
Platelets: 336 10*3/uL (ref 150–450)
RBC: 4.4 x10E6/uL (ref 3.77–5.28)
RDW: 12.8 % (ref 11.7–15.4)
WBC: 9.6 10*3/uL (ref 3.4–10.8)

## 2019-02-19 LAB — COMPREHENSIVE METABOLIC PANEL
ALT: 33 IU/L — ABNORMAL HIGH (ref 0–32)
AST: 23 IU/L (ref 0–40)
Albumin/Globulin Ratio: 1.4 (ref 1.2–2.2)
Albumin: 4 g/dL (ref 3.8–4.8)
Alkaline Phosphatase: 145 IU/L — ABNORMAL HIGH (ref 39–117)
BUN/Creatinine Ratio: 10 (ref 9–23)
BUN: 8 mg/dL (ref 6–20)
Bilirubin Total: 0.2 mg/dL (ref 0.0–1.2)
CHLORIDE: 106 mmol/L (ref 96–106)
CO2: 22 mmol/L (ref 20–29)
Calcium: 9.4 mg/dL (ref 8.7–10.2)
Creatinine, Ser: 0.81 mg/dL (ref 0.57–1.00)
GFR calc Af Amer: 108 mL/min/{1.73_m2} (ref >59)
GFR calc non Af Amer: 94 mL/min/{1.73_m2} (ref >59)
GLUCOSE: 81 mg/dL (ref 65–99)
Globulin, Total: 2.8 g/dL (ref 1.5–4.5)
Potassium: 4.4 mmol/L (ref 3.5–5.2)
Sodium: 141 mmol/L (ref 134–144)
Total Protein: 6.8 g/dL (ref 6.0–8.5)

## 2019-02-19 LAB — HEMOGLOBIN A1C
Est. average glucose Bld gHb Est-mCnc: 94 mg/dL
HEMOGLOBIN A1C: 4.9 % (ref 4.8–5.6)

## 2019-02-19 LAB — RPR: RPR Ser Ql: NONREACTIVE

## 2019-02-19 LAB — LIPID PANEL
CHOL/HDL RATIO: 3.5 ratio (ref 0.0–4.4)
Cholesterol, Total: 159 mg/dL (ref 100–199)
HDL: 45 mg/dL (ref >39)
LDL Calculated: 90 mg/dL (ref 0–99)
TRIGLYCERIDES: 120 mg/dL (ref 0–149)
VLDL Cholesterol Cal: 24 mg/dL (ref 5–40)

## 2019-02-19 LAB — B12 AND FOLATE PANEL
Folate: 17.2 ng/mL (ref >3.0)
Vitamin B-12: 561 pg/mL (ref 232–1245)

## 2019-02-19 LAB — TSH+FREE T4
FREE T4: 0.95 ng/dL (ref 0.82–1.77)
TSH: 2.69 u[IU]/mL (ref 0.450–4.500)

## 2019-02-19 LAB — VITAMIN D 25 HYDROXY (VIT D DEFICIENCY, FRACTURES): Vit D, 25-Hydroxy: 63.1 ng/mL (ref 30.0–100.0)

## 2019-02-19 LAB — HEPATITIS C ANTIBODY: Hep C Virus Ab: <0.1 s/co ratio (ref 0.0–0.9)

## 2019-02-19 LAB — HIV ANTIBODY (ROUTINE TESTING W REFLEX): HIV Screen 4th Generation wRfx: NONREACTIVE

## 2019-03-08 ENCOUNTER — Ambulatory Visit: Payer: BLUE CROSS/BLUE SHIELD | Admitting: Obstetrics and Gynecology

## 2019-03-09 DIAGNOSIS — F331 Major depressive disorder, recurrent, moderate: Secondary | ICD-10-CM | POA: Diagnosis not present

## 2019-03-09 DIAGNOSIS — F410 Panic disorder [episodic paroxysmal anxiety] without agoraphobia: Secondary | ICD-10-CM | POA: Diagnosis not present

## 2019-03-09 DIAGNOSIS — F411 Generalized anxiety disorder: Secondary | ICD-10-CM | POA: Diagnosis not present

## 2019-03-16 ENCOUNTER — Ambulatory Visit: Payer: BLUE CROSS/BLUE SHIELD | Admitting: Obstetrics and Gynecology

## 2019-03-16 ENCOUNTER — Telehealth: Payer: Self-pay | Admitting: Obstetrics and Gynecology

## 2019-03-16 NOTE — Telephone Encounter (Signed)
Pt is needing to get a colpo done from her annual that she had in March, however her insurance is out of network for [place]. She is wanting to know if we could refer her to someone that is in her network for her insurance. She has the [employer] through [employer].  Cb# [phone]

## 2019-03-16 NOTE — Telephone Encounter (Signed)
Called pt, no answer, phone stopped giving a ringtone, couldn't leave a voice msg.

## 2019-03-16 NOTE — Telephone Encounter (Signed)
Pt needs to find out who is in her network from her insurance co. We can then do referral.

## 2019-03-23 DIAGNOSIS — Z9884 Bariatric surgery status: Secondary | ICD-10-CM | POA: Diagnosis not present

## 2019-03-23 DIAGNOSIS — Z6836 Body mass index (BMI) 36.0-36.9, adult: Secondary | ICD-10-CM | POA: Insufficient documentation

## 2019-04-06 DIAGNOSIS — F410 Panic disorder [episodic paroxysmal anxiety] without agoraphobia: Secondary | ICD-10-CM | POA: Diagnosis not present

## 2019-04-06 DIAGNOSIS — F411 Generalized anxiety disorder: Secondary | ICD-10-CM | POA: Diagnosis not present

## 2019-04-06 DIAGNOSIS — F331 Major depressive disorder, recurrent, moderate: Secondary | ICD-10-CM | POA: Diagnosis not present

## 2019-11-10 DIAGNOSIS — R9431 Abnormal electrocardiogram [ECG] [EKG]: Secondary | ICD-10-CM | POA: Insufficient documentation

## 2019-11-23 ENCOUNTER — Other Ambulatory Visit: Payer: Self-pay | Admitting: Gastroenterology

## 2019-11-23 DIAGNOSIS — R748 Abnormal levels of other serum enzymes: Secondary | ICD-10-CM

## 2019-11-29 ENCOUNTER — Ambulatory Visit: Admission: RE | Admit: 2019-11-29 | Payer: 59 | Source: Ambulatory Visit

## 2019-11-29 ENCOUNTER — Other Ambulatory Visit: Payer: BLUE CROSS/BLUE SHIELD

## 2019-12-15 ENCOUNTER — Ambulatory Visit
Admission: RE | Admit: 2019-12-15 | Discharge: 2019-12-15 | Disposition: A | Discharge status: Home / Self Care | Payer: 59 | Source: Ambulatory Visit | Attending: Gastroenterology | Admitting: Gastroenterology

## 2019-12-15 ENCOUNTER — Other Ambulatory Visit: Payer: Self-pay

## 2019-12-15 DIAGNOSIS — R748 Abnormal levels of other serum enzymes: Secondary | ICD-10-CM | POA: Diagnosis present

## 2020-01-31 ENCOUNTER — Other Ambulatory Visit: Payer: Self-pay

## 2020-01-31 ENCOUNTER — Ambulatory Visit (INDEPENDENT_AMBULATORY_CARE_PROVIDER_SITE_OTHER): Payer: 59 | Admitting: Obstetrics and Gynecology

## 2020-01-31 ENCOUNTER — Other Ambulatory Visit (HOSPITAL_COMMUNITY)
Admission: RE | Admit: 2020-01-31 | Discharge: 2020-01-31 | Disposition: A | Discharge status: Home / Self Care | Payer: 59 | Source: Ambulatory Visit | Attending: Obstetrics and Gynecology | Admitting: Obstetrics and Gynecology

## 2020-01-31 ENCOUNTER — Encounter: Payer: Self-pay | Admitting: Obstetrics and Gynecology

## 2020-01-31 VITALS — BP 122/82 | Ht 64.0 in | Wt 242.0 lb

## 2020-01-31 DIAGNOSIS — R87810 Cervical high risk human papillomavirus (HPV) DNA test positive: Secondary | ICD-10-CM

## 2020-01-31 DIAGNOSIS — Z124 Encounter for screening for malignant neoplasm of cervix: Secondary | ICD-10-CM

## 2020-01-31 DIAGNOSIS — B977 Papillomavirus as the cause of diseases classified elsewhere: Secondary | ICD-10-CM | POA: Diagnosis present

## 2020-01-31 NOTE — Progress Notes (Signed)
Patient ID: Jenna Mcconnell, female   DOB: [birth year], 39 y.o.   MRN: [id number]  Reason for Consult: Colposcopy   Referred by No ref. provider found  Subjective:     HPI:  Jenna Mcconnell is a 39 y.o. female she presents today for a colposcopy.  She had a Pap smear in March 2020 that was normal and HPV positive.  HPV genotyping was not done.  She has no other complaints at this time.  Past Medical History:  Diagnosis Date  . Anxiety   . Chlamydia 2014  . Female hirsutism   . 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 Major depression   . Polycystic ovaries    Family History  Problem Relation Age of Onset  . Diabetes Mother        type 2  . Hypertension Mother   . Uterine cancer Mother   . Heart disease Father   . Diabetes Father        type 2  . Hypertension Father   . Colon cancer Maternal Grandmother    Past Surgical History:  Procedure Laterality Date  . BARIATRIC SURGERY    . CHOLECYSTECTOMY, LAPAROSCOPIC    . COLPOSCOPY      Short Social History:  Social History   Tobacco Use  . Smoking status: Never Smoker  . Smokeless tobacco: Never Used  Substance Use Topics  . Alcohol use: No    Allergies  Allergen Reactions  . Penicillins Rash    Current Outpatient Medications  Medication Sig Dispense Refill  . ascorbic acid (VITAMIN C) 1000 MG tablet Take by mouth.    Marland Kitchen buPROPion (WELLBUTRIN XL) 300 MG 24 hr tablet Take 1 tablet by mouth daily.    . cetirizine (ZYRTEC) 10 MG tablet Take 1 tablet by mouth daily.    Marland Kitchen Desvenlafaxine Succinate ER 25 MG TB24 Take by mouth.    . folic acid (FOLVITE) 270 MCG tablet Take by mouth.    . Levonorgestrel-Ethinyl Estradiol (ASHLYNA) 0.15-0.03 &0.01 MG tablet Take 1 tablet by mouth daily. 3 Package 4  . LORazepam (ATIVAN) 1 MG tablet Take 1/2  to 1 tablet twice daily as needed for anxiety    . Multiple Vitamin (MULTI-VITAMINS) TABS Take by mouth.    . Multiple Vitamins-Minerals (MULTIVITAMIN ADULT EXTRA  C PO) Take 1 tablet by mouth daily.    Marland Kitchen omeprazole (PRILOSEC) 20 MG capsule omeprazole 20 mg capsule,delayed release  Take 1 capsule every day by oral route as directed for 30 days.    . valACYclovir (VALTREX) 1000 MG tablet Take 0.5 tablets (500 mg total) by mouth 2 (two) times daily. X 3 days prn sx 30 tablet 0   No current facility-administered medications for this visit.    Review of Systems  Constitutional: Negative for chills, fatigue, fever and unexpected weight change.  HENT: Negative for trouble swallowing.  Eyes: Negative for loss of vision.  Respiratory: Negative for cough, shortness of breath and wheezing.  Cardiovascular: Negative for chest pain, leg swelling, palpitations and syncope.  GI: Negative for abdominal pain, blood in stool, diarrhea, nausea and vomiting.  GU: Negative for difficulty urinating, dysuria, frequency and hematuria.  Musculoskeletal: Negative for back pain, leg pain and joint pain.  Skin: Negative for rash.  Neurological: Negative for dizziness, headaches, light-headedness, numbness and seizures.  Psychiatric: Negative for behavioral problem, confusion, depressed mood and sleep disturbance.        Objective:  Objective  Vitals:   01/31/20 1048  BP: 122/82  Weight: 242 lb (109.8 kg)  Height: 5\' 4"  (1.626 m)   Body mass index is 41.54 kg/m.  Physical Exam Vitals and nursing note reviewed.  Constitutional:      Appearance: She is well-developed.  HENT:     Head: Normocephalic and atraumatic.  Eyes:     Pupils: Pupils are equal, round, and reactive to light.  Cardiovascular:     Rate and Rhythm: Normal rate and regular rhythm.  Pulmonary:     Effort: Pulmonary effort is normal. No respiratory distress.  Genitourinary:    Comments: External: Normal appearing vulva. No lesions noted.  Speculum examination: Normal appearing cervix. No blood in the vaginal vault. No discharge.  Skin:    General: Skin is warm and dry.  Neurological:      Mental Status: She is alert and oriented to person, place, and time.  Psychiatric:        Behavior: Behavior normal.        Thought Content: Thought content normal.        Judgment: Judgment normal.        Assessment/Plan:     39 year old with an IL HPV positive Pap 11 months ago. Will repeat Pap smear today with HPV testing and reflux genotyping. Will plan colposcopy based off of results of today's Pap smear if needed.   More than 10 minutes were spent face to face with the patient in the room with more than 50% of the time spent providing counseling and discussing the plan of management.   30 MD [facility], [facility] 01/31/2020 11:46 AM

## 2020-02-04 LAB — CYTOLOGY - PAP
Comment: NEGATIVE
Comment: NEGATIVE
Comment: NEGATIVE
Diagnosis: NEGATIVE
HPV 16: POSITIVE — AB
HPV 18 / 45: NEGATIVE
High risk HPV: POSITIVE — AB

## 2020-02-07 NOTE — Progress Notes (Signed)
SCHEDULED COLPO BUT PT WOULD LIKE A CALL BACK.

## 2020-02-07 NOTE — Progress Notes (Signed)
Called patient- no answer- left generic message asking her to return call. Will need colposcopy

## 2020-02-08 NOTE — Progress Notes (Signed)
Called back- no answer- left message- if she returns call I'm happy to speak with her. Thank you

## 2020-02-24 ENCOUNTER — Other Ambulatory Visit: Payer: Self-pay | Admitting: Obstetrics and Gynecology

## 2020-02-24 ENCOUNTER — Other Ambulatory Visit: Payer: Self-pay

## 2020-02-24 ENCOUNTER — Encounter: Payer: Self-pay | Admitting: Obstetrics and Gynecology

## 2020-02-24 ENCOUNTER — Ambulatory Visit (INDEPENDENT_AMBULATORY_CARE_PROVIDER_SITE_OTHER): Payer: 59 | Admitting: Obstetrics and Gynecology

## 2020-02-24 ENCOUNTER — Other Ambulatory Visit (HOSPITAL_COMMUNITY)
Admission: RE | Admit: 2020-02-24 | Discharge: 2020-02-24 | Disposition: A | Discharge status: Home / Self Care | Payer: 59 | Source: Ambulatory Visit | Attending: Obstetrics and Gynecology | Admitting: Obstetrics and Gynecology

## 2020-02-24 VITALS — BP 138/80 | Ht 64.0 in | Wt 245.0 lb

## 2020-02-24 DIAGNOSIS — Z124 Encounter for screening for malignant neoplasm of cervix: Secondary | ICD-10-CM | POA: Insufficient documentation

## 2020-02-24 DIAGNOSIS — B977 Papillomavirus as the cause of diseases classified elsewhere: Secondary | ICD-10-CM

## 2020-02-24 DIAGNOSIS — R87810 Cervical high risk human papillomavirus (HPV) DNA test positive: Secondary | ICD-10-CM | POA: Diagnosis not present

## 2020-02-24 NOTE — Addendum Note (Signed)
Addended by: Adelene Idler on: 02/24/2020 03:44 PM   Modules accepted: Orders

## 2020-02-24 NOTE — Progress Notes (Signed)
   GYNECOLOGY CLINIC COLPOSCOPY PROCEDURE NOTE  39 y.o. 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 Discussed underlying role for HPV infection in the development of cervical dysplasia, its natural history and progression/regression, need for surveillance.  Is the patient  pregnant: No LMP: Patient's last menstrual period was 01/22/2020 (exact date). Smoking status:  reports that she has never smoked. She has never used smokeless tobacco. Contraception: OCP (estrogen/progesterone) Future fertility desired:  No  Patient given informed consent, signed copy in the chart, time out was performed.  The patient was position in dorsal lithotomy position. Speculum was placed the cervix was visualized.   After application of acetic acid colposcopic inspection of the cervix was undertaken.   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   ECC specimen obtained:  Yes  All specimens were labeled and sent to pathology.   Patient was given post procedure instructions.  Will follow up pathology and manage accordingly.  Routine preventative health maintenance measures emphasized.  Physical Exam Genitourinary:       Adelene Idler MD [facility], [facility] 02/24/2020 1:52 PM

## 2020-02-28 ENCOUNTER — Telehealth: Payer: Self-pay | Admitting: Obstetrics and Gynecology

## 2020-02-28 LAB — SURGICAL PATHOLOGY

## 2020-02-28 NOTE — Telephone Encounter (Signed)
Patient is calling for labs results. Please advise. 

## 2020-02-28 NOTE — Progress Notes (Signed)
Please send patient letter regarding result and plan for follow up.  Thank you,  Dr. Jerene Pitch

## 2020-02-28 NOTE — Telephone Encounter (Signed)
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

## 2020-02-28 NOTE — Progress Notes (Signed)
Called patient 02/28/2020 at 8:36 am, no answer, left message to return call. Will need repeat pap smear in 1 year.

## 2020-03-08 ENCOUNTER — Other Ambulatory Visit: Payer: Self-pay | Admitting: Obstetrics and Gynecology

## 2020-03-08 DIAGNOSIS — Z3041 Encounter for surveillance of contraceptive pills: Secondary | ICD-10-CM

## 2020-03-20 ENCOUNTER — Telehealth: Payer: Self-pay

## 2020-03-20 NOTE — Telephone Encounter (Signed)
Patient requesting 1 yr refill of her OCP's from [facility]. She is aware it may be being denied as she hasn't been in for her annual, but states she saw Dr. Jerene Pitch for pap smear and colpscopy 01/2020 & 02/2020

## 2020-03-20 NOTE — Telephone Encounter (Signed)
There is more to annual that the pap smear. She is due for breast exam, pelvic exam, ovarall hx. Pt to shed annual. Can RF meds until then if needed.

## 2020-03-21 NOTE — Telephone Encounter (Signed)
Pt called back, tried explaining what ABC said but she would like you to call her back. She states she gets pap done with primary and [facility] has done COLPO on her and was told she didn't have to come back for a year.

## 2020-03-21 NOTE — Telephone Encounter (Signed)
Called pt, no answer, LVMTRC. 

## 2020-03-21 NOTE — Telephone Encounter (Signed)
Spoke to pt. Pls call pt to schedule annual and let me know once done so I can send [facility] RF to [facility] in [place].

## 2020-04-04 ENCOUNTER — Other Ambulatory Visit: Payer: Self-pay

## 2020-04-04 DIAGNOSIS — Z3041 Encounter for surveillance of contraceptive pills: Secondary | ICD-10-CM

## 2020-04-04 MED ORDER — LEVONORGEST-ETH ESTRAD 91-DAY 0.15-0.03 &0.01 MG PO TABS: 1.0000 | ORAL_TABLET | Freq: Every day | ORAL | 4 refills | Status: DC

## 2020-04-18 ENCOUNTER — Inpatient Hospital Stay: Payer: 59

## 2020-04-18 ENCOUNTER — Inpatient Hospital Stay: Payer: 59 | Attending: Oncology | Admitting: Oncology

## 2020-04-24 ENCOUNTER — Ambulatory Visit (INDEPENDENT_AMBULATORY_CARE_PROVIDER_SITE_OTHER): Payer: 59 | Admitting: Obstetrics and Gynecology

## 2020-04-24 ENCOUNTER — Other Ambulatory Visit: Payer: Self-pay

## 2020-04-24 ENCOUNTER — Encounter: Payer: Self-pay | Admitting: Obstetrics and Gynecology

## 2020-04-24 VITALS — BP 120/70 | Ht 64.0 in | Wt 237.0 lb

## 2020-04-24 DIAGNOSIS — B0089 Other herpesviral infection: Secondary | ICD-10-CM | POA: Diagnosis not present

## 2020-04-24 DIAGNOSIS — Z3041 Encounter for surveillance of contraceptive pills: Secondary | ICD-10-CM | POA: Diagnosis not present

## 2020-04-24 DIAGNOSIS — Z01419 Encounter for gynecological examination (general) (routine) without abnormal findings: Secondary | ICD-10-CM | POA: Diagnosis not present

## 2020-04-24 MED ORDER — LEVONORGEST-ETH ESTRAD 91-DAY 0.15-0.03 &0.01 MG PO TABS: 1.0000 | ORAL_TABLET | Freq: Every day | ORAL | 4 refills | Status: DC

## 2020-04-24 MED ORDER — VALACYCLOVIR HCL 1 G PO TABS: 500.0000 mg | ORAL_TABLET | Freq: Two times a day (BID) | ORAL | 0 refills | Status: DC

## 2020-04-24 NOTE — Progress Notes (Signed)
PCP:  System, Provider Not In   Chief Complaint  Patient presents with  . Gynecologic Exam     HPI:      [name] is a 39 y.o. No obstetric history on file. who LMP was Patient's last menstrual period was 01/22/2020 (exact date)., presents today for her annual examination.  Her menses are Q3 months on cont dosing OCPs, lasting 4-5 days.  Dysmenorrhea none. She does not have intermenstrual bleeding. Hx of PCOS with irreg menses prior to OCPs. May want to conceive in future but is having medical issues currently. Takes multiple vits due to s/p bariatric surg.  Sex activity: currently sexually active. Contraception: OCPs. 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 3/20 pap was neg cells/POS HPV DNA. Colpo sched but canceled due to covid. Repeat pap due in 1 yr. 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  There is no FH of breast cancer. There is no FH of ovarian cancer. The patient does do self-breast exams.  Tobacco use: The patient denies current or previous tobacco use. Alcohol use: none No drug use.  Exercise: min active  She does get adequate calcium and Vitamin D in her diet.  Had normal labs 3/20 due to bariatric surgery 2013. Was then diagnosed with elevated LFTs 10/20 and has been seeing GI and rheumatology. Was started on plaquenil for pss  Hx of anxiety/depression. Was seeing psych and therapist due to family trauma.    Past Medical History:  Diagnosis Date  . Anxiety   . Chlamydia 2014  . Female hirsutism   . 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 Major depression   . Polycystic ovaries     Past Surgical History:  Procedure Laterality Date  . BARIATRIC SURGERY    . CHOLECYSTECTOMY, LAPAROSCOPIC    . COLPOSCOPY      Family History  Problem Relation Age of Onset  . Diabetes Mother        type 2  . Hypertension  Mother   . Uterine cancer Mother   . Heart disease Father   . Diabetes Father        type 2  . Hypertension Father   . Colon cancer Maternal Grandmother     Social History   Socioeconomic History  . Marital status: Married    Spouse name: Not on file  . Number of children: Not on file  . Years of education: Not on file  . Highest education level: Not on file  Occupational History  . Not on file  Tobacco Use  . Smoking status: Never Smoker  . Smokeless tobacco: Never Used  Substance and Sexual Activity  . Alcohol use: No  . Drug use: No  . Sexual activity: Yes    Birth control/protection: Pill  Other Topics Concern  . Not on file  Social History Narrative  . Not on file   Social Determinants of Health   Financial Resource Strain:   . Difficulty of Paying Living Expenses:   Food Insecurity:   . Worried About Charity fundraiser in the Last Year:   . Arboriculturist in the Last Year:   Transportation Needs:   . Film/video editor (Medical):   Marland Kitchen Lack of Transportation (Non-Medical):   Physical Activity:   . Days of Exercise per Week:   .  Minutes of Exercise per Session:   Stress:   . Feeling of Stress :   Social Connections:   . Frequency of Communication with Friends and Family:   . Frequency of Social Gatherings with Friends and Family:   . Attends Religious Services:   . Active Member of Clubs or Organizations:   . Attends Banker Meetings:   Marland Kitchen Marital Status:   Intimate Partner Violence:   . Fear of Current or Ex-Partner:   . Emotionally Abused:   Marland Kitchen Physically Abused:   . Sexually Abused:     Current Meds  Medication Sig  . buPROPion (WELLBUTRIN XL) 300 MG 24 hr tablet Take 1 tablet by mouth daily.  . cetirizine (ZYRTEC) 10 MG tablet Take 1 tablet by mouth daily.  . folic acid (FOLVITE) 400 MCG tablet Take by mouth.  . hydroxychloroquine (PLAQUENIL) 200 MG tablet Take 200 mg by mouth 2 (two) times daily.  . Levonorgestrel-Ethinyl  Estradiol (ASHLYNA) 0.15-0.03 &0.01 MG tablet Take 1 tablet by mouth daily.  . Multiple Vitamin (MULTI-VITAMINS) TABS Take by mouth.  . Multiple Vitamins-Minerals (MULTIVITAMIN ADULT EXTRA C PO) Take 1 tablet by mouth daily.  . valACYclovir (VALTREX) 1000 MG tablet Take 0.5 tablets (500 mg total) by mouth 2 (two) times daily. X 3 days prn sx  . [DISCONTINUED] Levonorgestrel-Ethinyl Estradiol (ASHLYNA) 0.15-0.03 &0.01 MG tablet Take 1 tablet by mouth daily.  . [DISCONTINUED] valACYclovir (VALTREX) 1000 MG tablet Take 0.5 tablets (500 mg total) by mouth 2 (two) times daily. X 3 days prn sx     ROS:  Review of Systems  Constitutional: Positive for fatigue. Negative for fever and unexpected weight change.  Respiratory: Negative for cough, shortness of breath and wheezing.   Cardiovascular: Negative for chest pain, palpitations and leg swelling.  Gastrointestinal: Negative for blood in stool, constipation, diarrhea, nausea and vomiting.  Endocrine: Negative for cold intolerance, heat intolerance and polyuria.  Genitourinary: Negative for dyspareunia, dysuria, flank pain, frequency, genital sores, hematuria, menstrual problem, pelvic pain, urgency, vaginal bleeding, vaginal discharge and vaginal pain.  Musculoskeletal: Positive for arthralgias. Negative for back pain, joint swelling and myalgias.  Skin: Negative for rash.  Neurological: Positive for headaches. Negative for dizziness, syncope, light-headedness and numbness.  Hematological: Negative for adenopathy.  Psychiatric/Behavioral: Positive for agitation and dysphoric mood. Negative for confusion, sleep disturbance and suicidal ideas. The patient is not nervous/anxious.      Objective: BP 120/70   Ht 5\' 4"  (1.626 m)   Wt 237 lb (107.5 kg)   LMP 01/22/2020 (Exact Date)   BMI 40.68 kg/m    Physical Exam Constitutional:      Appearance: She is well-developed.  Genitourinary:     Vulva, vagina, uterus, right adnexa and left adnexa  normal.     No vulval lesion or tenderness noted.     No vaginal discharge, erythema or tenderness.     No cervical motion tenderness or polyp.     Uterus is not enlarged or tender.     No right or left adnexal mass present.     Right adnexa not tender.     Left adnexa not tender.  Neck:     Thyroid: No thyromegaly.  Cardiovascular:     Rate and Rhythm: Normal rate and regular rhythm.     Heart sounds: Normal heart sounds. No murmur.  Pulmonary:     Effort: Pulmonary effort is normal.     Breath sounds: Normal breath sounds.  Chest:  Breasts:        Right: No mass, nipple discharge, skin change or tenderness.        Left: No mass, nipple discharge, skin change or tenderness.  Abdominal:     Palpations: Abdomen is soft.     Tenderness: There is no abdominal tenderness. There is no guarding.  Musculoskeletal:        General: Normal range of motion.     Cervical back: Normal range of motion.  Neurological:     General: No focal deficit present.     Mental Status: She is alert and oriented to person, place, and time.     Cranial Nerves: No cranial nerve deficit.  Skin:    General: Skin is warm and dry.  Psychiatric:        Mood and Affect: Mood normal.        Behavior: Behavior normal.        Thought Content: Thought content normal.        Judgment: Judgment normal.  Vitals reviewed.    Assessment/Plan: Encounter for annual routine gynecological examination  Encounter for surveillance of contraceptive pills - OCP RF - Plan: Levonorgestrel-Ethinyl Estradiol (ASHLYNA) 0.15-0.03 &0.01 MG tablet; OCP RF. F/u prn pre-conception counseling. Hx of PCOS/irregular menses.  Herpetic whitlow - On hands. Rx RF valtrex prn. - Plan: valACYclovir (VALTREX) 1000 MG tablet; Rx RF prn.  Meds ordered this encounter  Medications  . Levonorgestrel-Ethinyl Estradiol (ASHLYNA) 0.15-0.03 &0.01 MG tablet    Sig: Take 1 tablet by mouth daily.    Dispense:  3 Package    Refill:  4    Order  Specific Question:   Supervising Provider    Answer:   Nadara Mustard [id number]  . valACYclovir (VALTREX) 1000 MG tablet    Sig: Take 0.5 tablets (500 mg total) by mouth 2 (two) times daily. X 3 days prn sx    Dispense:  30 tablet    Refill:  0    Order Specific Question:   Supervising Provider    Answer:   Nadara Mustard [[id number]]             GYN counsel breast self exam, adequate intake of calcium and vitamin D, diet and exercise     F/U  Return in about 1 year (around 04/24/2021).  Tywaun Hiltner B. Janessa Mickle, PA-C 04/24/2020 2:49 PM

## 2020-04-24 NOTE — Patient Instructions (Signed)
I value your feedback and entrusting us with your care. If you get a [facility] patient survey, I would appreciate you taking the time to let us know about your experience today. Thank you!  As of November 18, 2019, your lab results will be released to your MyChart immediately, before I even have a chance to see them. Please give me time to review them and contact you if there are any abnormalities. Thank you for your patience.  

## 2020-05-04 ENCOUNTER — Encounter: Payer: Self-pay | Admitting: Obstetrics

## 2020-05-04 ENCOUNTER — Other Ambulatory Visit: Payer: Self-pay

## 2020-05-04 ENCOUNTER — Ambulatory Visit (INDEPENDENT_AMBULATORY_CARE_PROVIDER_SITE_OTHER): Payer: 59 | Admitting: Obstetrics

## 2020-05-04 VITALS — BP 122/70 | Ht 64.0 in | Wt 236.6 lb

## 2020-05-04 DIAGNOSIS — N939 Abnormal uterine and vaginal bleeding, unspecified: Secondary | ICD-10-CM | POA: Diagnosis not present

## 2020-05-04 NOTE — Progress Notes (Signed)
Obstetrics & Gynecology Office Visit   Chief Complaint:  Chief Complaint  Patient presents with  . Gynecologic Exam  . Menstrual Problem    History of Present Illness: Jenna Mcconnell presents c/o 4 days of heavy vaginal bleeding. She reports that she has been changing pads approx 10 x daily, and has been passing small clots. She was seen last week at [facility] for her annual PE. Has been on generic Seasonale for several years now. Finished a three month series of pills last week , and started her new three month pack this Sunday (4-5 days ago). Her heavier bleeding started that day and has been ongoing.She also shares that when she refilled her Rx last week, she was given a different generic OCP.  She denies any new medications. Has been folowed recently for some abnormal bloodwork (elevated LFTs, elevated WBCs) and is seeing a Heme Onc specialist.   Review of Systems:  Review of Systems  Constitutional: Negative.   HENT: Negative.   Eyes: Negative.   Respiratory: Negative.   Cardiovascular: Negative.   Gastrointestinal: Negative.   Genitourinary: Negative.   Musculoskeletal: Negative.   Skin: Negative.   Neurological: Negative.   Endo/Heme/Allergies:       Referred recently to Hem Onc for abnormal bloodwork by her PCP.   Psychiatric/Behavioral: Negative.        Hx of anxiety. Takes daily Wellbutrin generic. Well managed   - non contributory with exception of medical hx as noted below:    Past Medical History:  Past Medical History:  Diagnosis Date  . Anxiety   . Chlamydia 2014  . Female hirsutism   . 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 Major depression   . Polycystic ovaries     Past Surgical History:  Past Surgical History:  Procedure Laterality Date  . BARIATRIC SURGERY    . CHOLECYSTECTOMY, LAPAROSCOPIC    . COLPOSCOPY      Gynecologic History: Patient's last menstrual period was 04/29/2020.  Obstetric History: G0P0000  Family History:  Family  History  Problem Relation Age of Onset  . Diabetes Mother        type 2  . Hypertension Mother   . Uterine cancer Mother   . Heart disease Father   . Diabetes Father        type 2  . Hypertension Father   . Colon cancer Maternal Grandmother     Social History:  Social History   Socioeconomic History  . Marital status: Married    Spouse name: Not on file  . Number of children: Not on file  . Years of education: Not on file  . Highest education level: Not on file  Occupational History  . Not on file  Tobacco Use  . Smoking status: Never Smoker  . Smokeless tobacco: Never Used  Substance and Sexual Activity  . Alcohol use: No  . Drug use: No  . Sexual activity: Yes    Birth control/protection: Pill  Other Topics Concern  . Not on file  Social History Narrative  . Not on file   Social Determinants of Health   Financial Resource Strain:   . Difficulty of Paying Living Expenses:   Food Insecurity:   . Worried About Programme researcher, broadcasting/film/video in the Last Year:   . Barista in the Last Year:   Transportation Needs:   . Freight forwarder (Medical):   Marland Kitchen Lack of Transportation (Non-Medical):  Physical Activity:   . Days of Exercise per Week:   . Minutes of Exercise per Session:   Stress:   . Feeling of Stress :   Social Connections:   . Frequency of Communication with Friends and Family:   . Frequency of Social Gatherings with Friends and Family:   . Attends Religious Services:   . Active Member of Clubs or Organizations:   . Attends Banker Meetings:   Marland Kitchen Marital Status:   Intimate Partner Violence:   . Fear of Current or Ex-Partner:   . Emotionally Abused:   Marland Kitchen Physically Abused:   . Sexually Abused:     Allergies:  Allergies  Allergen Reactions  . Penicillins Rash    Medications: Prior to Admission medications   Medication Sig Start Date End Date Taking? Authorizing Provider  buPROPion (WELLBUTRIN XL) 300 MG 24 hr tablet Take 1  tablet by mouth daily. 10/22/15  Yes [provider]  cetirizine (ZYRTEC) 10 MG tablet Take 1 tablet by mouth daily.   Yes [provider]  folic acid (FOLVITE) 400 MCG tablet Take by mouth.   Yes [provider]  hydroxychloroquine (PLAQUENIL) 200 MG tablet Take 200 mg by mouth 2 (two) times daily. 04/19/20  Yes [provider]  Levonorgestrel-Ethinyl Estradiol (ASHLYNA) 0.15-0.03 &0.01 MG tablet Take 1 tablet by mouth daily. 04/24/20  Yes Copland, Ilona Sorrel, PA-C  Multiple Vitamin (MULTI-VITAMINS) TABS Take by mouth.   Yes [provider]  Multiple Vitamins-Minerals (MULTIVITAMIN ADULT EXTRA C PO) Take 1 tablet by mouth daily.   Yes [provider]  valACYclovir (VALTREX) 1000 MG tablet Take 0.5 tablets (500 mg total) by mouth 2 (two) times daily. X 3 days prn sx 04/24/20  Yes Copland, Ilona Sorrel, PA-C    Physical Exam Vitals:  Vitals:   05/04/20 1430  BP: 122/70   Patient's last menstrual period was 04/29/2020.  Physical Exam  GI: Hernia confirmed negative in the right inguinal area and confirmed negative in the left inguinal area.  Genitourinary:    Uterus and rectum normal.     Pelvic exam was performed with patient supine.  No labial fusion. There is no rash, tenderness, lesion or injury on the right labia. There is no rash, tenderness, lesion or injury on the left labia. Cervix exhibits discharge. Cervix exhibits no motion tenderness and no friability. Right adnexum displays no mass, no tenderness and no fullness. Left adnexum displays no mass, no tenderness and no fullness.    Vaginal erythema and bleeding present.     No vaginal tenderness.  There is erythema and bleeding in the vagina. No tenderness in the vagina.    No foreign body in the vagina.     No signs of injury in the vagina.   Lymphadenopathy:       Right: No inguinal adenopathy present.       Left: No inguinal adenopathy present.     Assessment: 39 y.o. G0P0000   With AUB likely related to change in generic OSP regime.   Plan: Problem List Items Addressed This Visit      Genitourinary   Abnormal uterine and vaginal bleeding, unspecified    Other Visit Diagnoses    Abnormal uterine bleeding    -  Primary     Plan: Discussed possibility of her new generic carrying a lower amount of estrogen in the pills- that the change in generic may be responsibility.Discussed with Dr. Tiburcio Pea, and we will place her on three  days of TIC OCP pills starting tonight, with a taper from 3 to 2 pills BID for 2 days, then back to her usual one po daily. She will contact the office if this does not suppress the flow adequately. Advised her to RTC for f/u if not improved.  She verbalized understanding. May also consider dhanging to another name brand OCP or consulting her pharmacist abouther present RX.  Imagene Riches, CNM  05/04/2020 3:33 PM

## 2020-06-15 ENCOUNTER — Encounter: Payer: Self-pay | Admitting: Oncology

## 2020-06-15 ENCOUNTER — Other Ambulatory Visit: Payer: Self-pay

## 2020-06-15 ENCOUNTER — Inpatient Hospital Stay: Payer: 59

## 2020-06-15 ENCOUNTER — Inpatient Hospital Stay: Payer: 59 | Attending: Oncology | Admitting: Oncology

## 2020-06-15 VITALS — BP 130/65 | HR 91 | Temp 97.6°F | Resp 18 | Ht 64.0 in | Wt 240.8 lb

## 2020-06-15 DIAGNOSIS — D729 Disorder of white blood cells, unspecified: Secondary | ICD-10-CM

## 2020-06-15 DIAGNOSIS — D72829 Elevated white blood cell count, unspecified: Secondary | ICD-10-CM | POA: Insufficient documentation

## 2020-06-15 LAB — TECHNOLOGIST SMEAR REVIEW: Plt Morphology: NORMAL

## 2020-06-15 LAB — CBC WITH DIFFERENTIAL/PLATELET
Abs Immature Granulocytes: 0.04 10*3/uL (ref 0.00–0.07)
Basophils Absolute: 0 10*3/uL (ref 0.0–0.1)
Basophils Relative: 0 %
Eosinophils Absolute: 0.2 10*3/uL (ref 0.0–0.5)
Eosinophils Relative: 2 %
HCT: 38.4 % (ref 36.0–46.0)
Hemoglobin: 13.3 g/dL (ref 12.0–15.0)
Immature Granulocytes: 0 %
Lymphocytes Relative: 27 %
Lymphs Abs: 2.9 10*3/uL (ref 0.7–4.0)
MCH: 31.9 pg (ref 26.0–34.0)
MCHC: 34.6 g/dL (ref 30.0–36.0)
MCV: 92.1 fL (ref 80.0–100.0)
Monocytes Absolute: 0.7 10*3/uL (ref 0.1–1.0)
Monocytes Relative: 6 %
Neutro Abs: 6.9 10*3/uL (ref 1.7–7.7)
Neutrophils Relative %: 65 %
Platelets: 391 10*3/uL (ref 150–400)
RBC: 4.17 MIL/uL (ref 3.87–5.11)
RDW: 13.3 % (ref 11.5–15.5)
WBC: 10.7 10*3/uL — ABNORMAL HIGH (ref 4.0–10.5)
nRBC: 0 % (ref 0.0–0.2)

## 2020-06-15 LAB — SEDIMENTATION RATE: Sed Rate: 52 mm/hr — ABNORMAL HIGH (ref 0–20)

## 2020-06-15 NOTE — Progress Notes (Signed)
New patient referred by Dr Kathryne Hitch for elevated white blood cell count.

## 2020-06-16 ENCOUNTER — Encounter: Payer: Self-pay | Admitting: Oncology

## 2020-06-16 NOTE — Progress Notes (Signed)
Hematology/Oncology Consult note [facility] [phone] [phone]  Patient Care Team: System, Provider Not In as PCP - General   Name of the patient: Jenna Mcconnell  [id number]  17-Sep-1981    Reason for referral-leukocytosis   Referring physician-Christine Jacqulyn Liner NP  Date of visit: 06/16/20   History of presenting illness-patient is a 39 year old female who was referred to [facility] GI for abnormal LFTs.  As a part of the work-up she had an autoimmune work-up done which showed positive ANA and she was referred to rheumatology.  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  Most recent CBC from 01/04/2020 showed white cell count of 13.1, H&H of 14.5/41.9 and a platelet count of 396.  Differential mainly showed neutrophilia with an absolute ANC of 9.8.  Most recent CMP showed normal AST ALT and bilirubin mildly elevated alkaline phosphatase of 145.  GGT was mildly elevated at 84.  Prior to that her CBC in December 2019 showed a normal white cell count of 9.7. Patient reports that her appetite is good and she is currently trying to lose weight.  Denies any recurrent infections or hospitalizations.  She does report generalized body aches as well as fatigue.  Denies any joint swelling or skin rash  ECOG PS- 0  Pain scale- 0   Review of systems- Review of Systems  Constitutional: Positive for malaise/fatigue. Negative for chills, fever and weight loss.       Bodyaches  HENT: Negative for congestion, ear discharge and nosebleeds.   Eyes: Negative for blurred vision.  Respiratory: Negative for cough, hemoptysis, sputum production, shortness of breath and wheezing.   Cardiovascular: Negative for chest pain, palpitations, orthopnea and claudication.  Gastrointestinal: Negative  for abdominal pain, blood in stool, constipation, diarrhea, heartburn, melena, nausea and vomiting.  Genitourinary: Negative for dysuria, flank pain, frequency, hematuria and urgency.  Musculoskeletal: Negative for back pain, joint pain and myalgias.  Skin: Negative for rash.  Neurological: Negative for dizziness, tingling, focal weakness, seizures, weakness and headaches.  Endo/Heme/Allergies: Does not bruise/bleed easily.  Psychiatric/Behavioral: Negative for depression and suicidal ideas. The patient does not have insomnia.     Allergies  Allergen Reactions  . Penicillins Rash    Patient Active Problem List   Diagnosis Date Noted  . Abnormal uterine and vaginal bleeding, unspecified 05/04/2020  . Herpetic whitlow 04/24/2020  . Abnormal ECG 11/10/2019  . Status post bariatric surgery 03/23/2019  . BMI 36.0-36.9,adult 03/23/2019  . 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 Anxiety   . Chlamydia 2014  . Female hirsutism   . 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 Major depression   . Polycystic ovaries      Past Surgical History:  Procedure Laterality Date  . BARIATRIC SURGERY    . CHOLECYSTECTOMY, LAPAROSCOPIC    . COLPOSCOPY      Social History   Socioeconomic History  . Marital status: Married    Spouse name: Not on file  . Number of children: Not on file  . Years of education: Not on file  . Highest education level: Not on file  Occupational History  . Not on file  Tobacco Use  . Smoking status: Never Smoker  . Smokeless tobacco: Never Used  Vaping Use  . Vaping Use:  Never used  Substance and Sexual Activity  . Alcohol use: No  . Drug use: No  . Sexual activity: Yes    Birth control/protection: Pill  Other Topics Concern  . Not on file  Social History Narrative  . Not on file   Social Determinants of Health   Financial Resource Strain:   . Difficulty of Paying Living Expenses:   Food Insecurity:     . Worried About Charity fundraiser in the Last Year:   . Arboriculturist in the Last Year:   Transportation Needs:   . Film/video editor (Medical):   Marland Kitchen Lack of Transportation (Non-Medical):   Physical Activity:   . Days of Exercise per Week:   . Minutes of Exercise per Session:   Stress:   . Feeling of Stress :   Social Connections:   . Frequency of Communication with Friends and Family:   . Frequency of Social Gatherings with Friends and Family:   . Attends Religious Services:   . Active Member of Clubs or Organizations:   . Attends Archivist Meetings:   Marland Kitchen Marital Status:   Intimate Partner Violence:   . Fear of Current or Ex-Partner:   . Emotionally Abused:   Marland Kitchen Physically Abused:   . Sexually Abused:      Family History  Problem Relation Age of Onset  . Diabetes Mother        type 2  . Hypertension Mother   . Bladder Cancer Mother   . Heart disease Father   . Diabetes Father        type 2  . Hypertension Father   . Lymphoma Maternal Grandfather      Current Outpatient Medications:  .  buPROPion (WELLBUTRIN XL) 300 MG 24 hr tablet, Take 1 tablet by mouth daily., Disp: , Rfl:  .  cetirizine (ZYRTEC) 10 MG tablet, Take 1 tablet by mouth daily., Disp: , Rfl:  .  hydroxychloroquine (PLAQUENIL) 200 MG tablet, Take 200 mg by mouth 2 (two) times daily., Disp: , Rfl:  .  Levonorgestrel-Ethinyl Estradiol (ASHLYNA) 0.15-0.03 &0.01 MG tablet, Take 1 tablet by mouth daily., Disp: 3 Package, Rfl: 4 .  Multiple Vitamin (MULTI-VITAMINS) TABS, Take by mouth., Disp: , Rfl:  .  valACYclovir (VALTREX) 1000 MG tablet, Take 0.5 tablets (500 mg total) by mouth 2 (two) times daily. X 3 days prn sx, Disp: 30 tablet, Rfl: 0   Physical exam:  Vitals:   06/15/20 1519  BP: 130/65  Pulse: 91  Resp: 18  Temp: 97.6 F (36.4 C)  TempSrc: Tympanic  SpO2: 100%  Weight: 240 lb 12.8 oz (109.2 kg)  Height: 5' 4"  (1.626 m)   Physical Exam Constitutional:      General: She  is not in acute distress.    Appearance: She is obese.  Cardiovascular:     Rate and Rhythm: Normal rate and regular rhythm.     Heart sounds: Normal heart sounds.  Pulmonary:     Effort: Pulmonary effort is normal.     Breath sounds: Normal breath sounds.  Abdominal:     General: Bowel sounds are normal. There is no distension.     Palpations: Abdomen is soft.     Tenderness: There is no abdominal tenderness.  Lymphadenopathy:     Comments: No palpable cervical, supraclavicular, axillary or inguinal adenopathy   Skin:    General: Skin is warm and dry.  Neurological:     Mental Status: She  is alert and oriented to person, place, and time.        CMP Latest Ref Rng & Units 02/18/2019  Glucose 65 - 99 mg/dL 81  BUN 6 - 20 mg/dL 8  Creatinine 0.57 - 1.00 mg/dL 0.81  Sodium 134 - 144 mmol/L 141  Potassium 3.5 - 5.2 mmol/L 4.4  Chloride 96 - 106 mmol/L 106  CO2 20 - 29 mmol/L 22  Calcium 8.7 - 10.2 mg/dL 9.4  Total Protein 6.0 - 8.5 g/dL 6.8  Total Bilirubin 0.0 - 1.2 mg/dL 0.2  Alkaline Phos 39 - 117 IU/L 145(H)  AST 0 - 40 IU/L 23  ALT 0 - 32 IU/L 33(H)   CBC Latest Ref Rng & Units 06/15/2020  WBC 4.0 - 10.5 K/uL 10.7(H)  Hemoglobin 12.0 - 15.0 g/dL 13.3  Hematocrit 36 - 46 % 38.4  Platelets 150 - 400 K/uL 391    Assessment and plan- Patient is a 39 y.o. female referred for leukocytosis mainly neutrophilia  Patient noted to have a mild leukocytosis with a white count of 13 with predominant neutrophilia back in January 2021.  Given her positive ANA and new diagnosis of undifferentiated connective tissue disorder this could all be reactive.  She does not have abnormal hemoglobin or platelet count.  Her prior white cell count in 2019 was normal.  Today I will check her CBC with differential, ESR and smear review.  I will inform the patient the results of her blood work via phone or email.  At this time I am inclined to monitor this conservatively and I will see her back in 4  months with a CBC with differential.  At that time if her counts are overall stable she can go back to seeing her primary care doctor   Thank you for this kind referral and the opportunity to participate in the care of this patient   Visit Diagnosis 1. Neutrophilia     Dr. Randa Evens, MD, MPH [facility] [phone] 06/16/2020

## 2020-07-18 ENCOUNTER — Encounter: Payer: Self-pay | Admitting: Obstetrics and Gynecology

## 2020-07-18 ENCOUNTER — Other Ambulatory Visit: Payer: Self-pay | Admitting: Obstetrics and Gynecology

## 2020-07-18 DIAGNOSIS — Z3041 Encounter for surveillance of contraceptive pills: Secondary | ICD-10-CM

## 2020-07-18 MED ORDER — ASHLYNA 0.15-0.03 &0.01 MG PO TABS: 1.0000 | ORAL_TABLET | Freq: Every day | ORAL | 0 refills | Status: DC

## 2020-07-18 NOTE — Progress Notes (Signed)
Rx RF Jenna Mcconnell due to needing to fill early. Had to take extra pills to stop bleeidng.

## 2020-07-25 ENCOUNTER — Other Ambulatory Visit: Payer: Self-pay | Admitting: Obstetrics and Gynecology

## 2020-07-25 DIAGNOSIS — Z3041 Encounter for surveillance of contraceptive pills: Secondary | ICD-10-CM

## 2020-07-28 ENCOUNTER — Telehealth: Payer: Self-pay

## 2020-07-28 NOTE — Telephone Encounter (Signed)
Pt calling; is having the same problem as in May - heavy bleeding and clotting x1wk now; does she need to double oup on her bcp like she did before to stop it?  Please let her know something before the weekend.  [phone] Courtesy call to pt to let her know [facility] not in office today but will forward msg to her.  Pt states she is passing clots q15-18minutes and she started active pills on Monday.  Pt states she is saturating a pad q62min-1hr.  As it's our policy for her to go to the ED.  Pt states 'okay. Will you still send msg to [facility]?  Adv I would.

## 2020-07-30 NOTE — Telephone Encounter (Signed)
Pls follow up with pt to see if she is still having excessive bleeding. Since sx recurred, pt needs GYN u/s to assess further.

## 2020-07-31 NOTE — Telephone Encounter (Signed)
Called pt, no answer, LVMTRC. 

## 2020-08-01 NOTE — Telephone Encounter (Signed)
Called again, no answer, LVMTRC.

## 2020-08-02 NOTE — Telephone Encounter (Signed)
Pt aware. Mentioned as of today bleeding/clots has slowed down. Will give it some time hopefully it doesn't come back as it was. Will f/u if needed.

## 2020-08-02 NOTE — Telephone Encounter (Signed)
Patient is calling back to speak with Hoover Brunette, patient is aware Hoover Brunette is on lunch. And we will have her call her back once she returns

## 2020-08-21 ENCOUNTER — Encounter: Payer: Self-pay | Admitting: Obstetrics and Gynecology

## 2020-08-22 ENCOUNTER — Other Ambulatory Visit: Payer: Self-pay | Admitting: Obstetrics and Gynecology

## 2020-08-22 DIAGNOSIS — N939 Abnormal uterine and vaginal bleeding, unspecified: Secondary | ICD-10-CM

## 2020-08-22 NOTE — Telephone Encounter (Signed)
Patient is scheduled for gyn u/s and follow up 08/23/20 at 4. Please place order! Thank you.

## 2020-08-22 NOTE — Telephone Encounter (Signed)
Order placed. Thx 

## 2020-08-23 ENCOUNTER — Other Ambulatory Visit: Payer: Self-pay

## 2020-08-23 ENCOUNTER — Ambulatory Visit (INDEPENDENT_AMBULATORY_CARE_PROVIDER_SITE_OTHER): Payer: 59

## 2020-08-23 ENCOUNTER — Encounter: Payer: Self-pay | Admitting: Obstetrics and Gynecology

## 2020-08-23 ENCOUNTER — Other Ambulatory Visit (HOSPITAL_COMMUNITY)
Admission: RE | Admit: 2020-08-23 | Discharge: 2020-08-23 | Disposition: A | Discharge status: Home / Self Care | Payer: 59 | Source: Ambulatory Visit | Attending: Obstetrics and Gynecology | Admitting: Obstetrics and Gynecology

## 2020-08-23 ENCOUNTER — Ambulatory Visit: Payer: 59 | Admitting: Obstetrics and Gynecology

## 2020-08-23 VITALS — BP 100/70 | Ht 64.0 in | Wt 235.0 lb

## 2020-08-23 DIAGNOSIS — N939 Abnormal uterine and vaginal bleeding, unspecified: Secondary | ICD-10-CM | POA: Diagnosis not present

## 2020-08-23 DIAGNOSIS — Z113 Encounter for screening for infections with a predominantly sexual mode of transmission: Secondary | ICD-10-CM | POA: Insufficient documentation

## 2020-08-23 DIAGNOSIS — R102 Pelvic and perineal pain: Secondary | ICD-10-CM

## 2020-08-23 MED ORDER — CEFTRIAXONE SODIUM 250 MG IJ SOLR
250.0000 mg | Freq: Once | INTRAMUSCULAR | Status: AC
  Administered 2020-08-23: 250 mg via INTRAMUSCULAR

## 2020-08-23 MED ORDER — DOXYCYCLINE HYCLATE 100 MG PO CAPS: 100.0000 mg | ORAL_CAPSULE | Freq: Two times a day (BID) | ORAL | 0 refills | Status: AC

## 2020-08-23 MED ORDER — METRONIDAZOLE 500 MG PO TABS: 500.0000 mg | ORAL_TABLET | Freq: Two times a day (BID) | ORAL | 0 refills | Status: AC

## 2020-08-23 NOTE — Progress Notes (Signed)
System, Provider Not In   Chief Complaint  Patient presents with  . Follow-up    u/s    HPI:      [name] is a 39 y.o. [id number] whose LMP was Patient's last menstrual period was 07/21/2020 (exact date)., presents today for menorrhagia and pelvic pain with u/s f/u.  Her menses are usually Q3 months on cont dosing OCPs, lasting 4-5 days, mod flow.  Dysmenorrhea none. She does not have intermenstrual bleeding. Hx of PCOS with irreg menses prior to OCPs.  Had extremely heavy flow 5/21 menses, thought to be due to different generic pill that pill pack. Pt went back to [facility], doubled up on pills for a few days to stop the bleeding which worked. She did fine until menses 8/21. Started on time, lasted 7 days with normal flow, minimal dysmen. Pt restarted active pills and continued to bleed, with heavy flow for about 1-1/2 wks with large clots. Developed random sharp pelvic pains that have progressed and worsened since. Hurts to even stand up for any length of time now, feels like pulling sensation. Has tried NSAIDs and leftover tramadol without relief. Has to sit down, also has LBP. No GI, urin, vag sx. No fevers. Has BMs QD.  She is sex active, occas dysmen the last yr. No postcoital bleeding. She and husband have been together since 2019 but he has never had STD testing. Pt was neg in 2019.   Past Medical History:  Diagnosis Date  . Anxiety   . Chlamydia 2014  . Female hirsutism   . 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 Major depression   . Polycystic ovaries     Past Surgical History:  Procedure Laterality Date  . BARIATRIC SURGERY    . CHOLECYSTECTOMY, LAPAROSCOPIC    . COLPOSCOPY      Family History  Problem Relation Age of Onset  . Diabetes Mother        type 2  . Hypertension Mother   . Bladder Cancer Mother   . Heart disease Father   . Diabetes Father        type 2  . Hypertension Father   . Lymphoma Maternal Grandfather     Social  History   Socioeconomic History  . Marital status: Married    Spouse name: Not on file  . Number of children: Not on file  . Years of education: Not on file  . Highest education level: Not on file  Occupational History  . Not on file  Tobacco Use  . Smoking status: Never Smoker  . Smokeless tobacco: Never Used  Vaping Use  . Vaping Use: Never used  Substance and Sexual Activity  . Alcohol use: No  . Drug use: No  . Sexual activity: Yes    Birth control/protection: Pill  Other Topics Concern  . Not on file  Social History Narrative  . Not on file   Social Determinants of Health   Financial Resource Strain:   . Difficulty of Paying Living Expenses: Not on file  Food Insecurity:   . Worried About Programme researcher, broadcasting/film/video in the Last Year: Not on file  . Ran Out of Food in the Last Year: Not on file  Transportation Needs:   . Lack of Transportation (Medical): Not on file  . Lack of Transportation (Non-Medical): Not on file  Physical Activity:   . Days of Exercise per Week: Not on file  .  Minutes of Exercise per Session: Not on file  Stress:   . Feeling of Stress : Not on file  Social Connections:   . Frequency of Communication with Friends and Family: Not on file  . Frequency of Social Gatherings with Friends and Family: Not on file  . Attends Religious Services: Not on file  . Active Member of Clubs or Organizations: Not on file  . Attends Banker Meetings: Not on file  . Marital Status: Not on file  Intimate Partner Violence:   . Fear of Current or Ex-Partner: Not on file  . Emotionally Abused: Not on file  . Physically Abused: Not on file  . Sexually Abused: Not on file    Outpatient Medications Prior to Visit  Medication Sig Dispense Refill  . ASHLYNA 0.15-0.03 &0.01 MG tablet TAKE 1 TABLET BY MOUTH DAILY.*ASHLYNA ONLY* 91 tablet 1  . buPROPion (WELLBUTRIN XL) 300 MG 24 hr tablet Take 1 tablet by mouth daily.    . cetirizine (ZYRTEC) 10 MG tablet Take  1 tablet by mouth daily.    . hydroxychloroquine (PLAQUENIL) 200 MG tablet Take 200 mg by mouth 2 (two) times daily.    . Multiple Vitamin (MULTI-VITAMINS) TABS Take by mouth.    . valACYclovir (VALTREX) 1000 MG tablet Take 0.5 tablets (500 mg total) by mouth 2 (two) times daily. X 3 days prn sx 30 tablet 0   No facility-administered medications prior to visit.      ROS:  Review of Systems  Constitutional: Negative for fever.  Gastrointestinal: Negative for blood in stool, constipation, diarrhea, nausea and vomiting.  Genitourinary: Positive for menstrual problem and pelvic pain. Negative for dyspareunia, dysuria, flank pain, frequency, hematuria, urgency, vaginal bleeding, vaginal discharge and vaginal pain.  Musculoskeletal: Negative for back pain.  Skin: Negative for rash.  All other systems reviewed and are negative.  BREAST: No symptoms   OBJECTIVE:   Vitals:  BP 100/70   Ht 5\' 4"  (1.626 m)   Wt 235 lb (106.6 kg)   LMP 07/21/2020 (Exact Date)   BMI 40.34 kg/m   Physical Exam Vitals reviewed.  Constitutional:      Appearance: She is well-developed.  Pulmonary:     Effort: Pulmonary effort is normal.  Abdominal:     Palpations: Abdomen is soft.     Tenderness: There is abdominal tenderness in the suprapubic area. There is no guarding or rebound.  Genitourinary:    General: Normal vulva.     Pubic Area: No rash.      Labia:        Right: No rash, tenderness or lesion.        Left: No rash, tenderness or lesion.      Vagina: Bleeding present. No vaginal discharge, erythema or tenderness.     Cervix: Cervical motion tenderness present.     Uterus: Normal. Tender. Not enlarged.      Adnexa: Right adnexa normal and left adnexa normal.       Right: No mass or tenderness.         Left: No mass or tenderness.    Musculoskeletal:        General: Normal range of motion.     Cervical back: Normal range of motion.  Skin:    General: Skin is warm and dry.    Neurological:     General: No focal deficit present.     Mental Status: She is alert and oriented to person, place, and time.  Psychiatric:  Mood and Affect: Mood normal.        Behavior: Behavior normal.        Thought Content: Thought content normal.        Judgment: Judgment normal.     Results:  ULTRASOUND REPORT  Location: [facility]  Date of Service: 08/23/2020    Indications:Abnormal Uterine Bleeding  And Pelvic Pain  Findings:  The uterus is retroverted and measures 6.3 x 4.8 x 3.5 cm. Echo texture is heterogenous with evidence of focal masses. Within the uterus are multiple suspected fibroids measuring: Fibroid 1: 12.8 x 12.2 x 12.3 mm. Subserosal anterior Fibroid 2:13.0 x 12.9 x 14.0 mm  Intramural right Fibroid 3: 14.4 x 12.2 x 12.3 mm Intramural left  The Endometrium measures 5.5 mm.  The endometrium is somewhat ill-defined.   Right Ovary measures 2.6 x 1.4 x 1.4 cm. It is normal in appearance. Left Ovary measures 2.3 x 1.4 x 1.1 cm. It is normal in appearance. Survey of the adnexa demonstrates no adnexal masses. There is no free fluid in the cul de sac.  Impression: 1. There are three uterine fibroids seen.  2. Normal appearing ovaries.   Recommendations: 1.Clinical correlation with the patient's History and Physical Exam.   Deanna Artis, RT  Assessment/Plan: PID (acute pelvic inflammatory disease) - Plan: cefTRIAXone (ROCEPHIN) injection 250 mg, doxycycline (VIBRAMYCIN) 100 MG capsule, metroNIDAZOLE (FLAGYL) 500 MG tablet; Pos sx and exam, neg GYN u/s for other pathology. Check STDs. Presumed PID. Rx rocephin, doxy and flagyl. RTO in 2 wks for f/u, sooner prn. NSAIDs/heating pad for pain.  Screening for STD (sexually transmitted disease) - Plan: Cervicovaginal ancillary only   Meds ordered this encounter  Medications  . cefTRIAXone (ROCEPHIN) injection 250 mg  . doxycycline (VIBRAMYCIN) 100 MG capsule    Sig: Take 1  capsule (100 mg total) by mouth 2 (two) times daily for 14 days.    Dispense:  28 capsule    Refill:  0    Order Specific Question:   Supervising Provider    Answer:   Nadara Mustard [id number]  . metroNIDAZOLE (FLAGYL) 500 MG tablet    Sig: Take 1 tablet (500 mg total) by mouth 2 (two) times daily for 14 days.    Dispense:  28 tablet    Refill:  0    Order Specific Question:   Supervising Provider    Answer:   Nadara Mustard [[id number]]      Return in about 2 weeks (around 09/06/2020) for pelvic f/u.  Laterra Lubinski B. Laurence Crofford, PA-C 08/23/2020 5:12 PM

## 2020-08-23 NOTE — Patient Instructions (Signed)
I value your feedback and entrusting us with your care. If you get a [facility] patient survey, I would appreciate you taking the time to let us know about your experience today. Thank you!  As of November 18, 2019, your lab results will be released to your MyChart immediately, before I even have a chance to see them. Please give me time to review them and contact you if there are any abnormalities. Thank you for your patience.  

## 2020-08-25 ENCOUNTER — Encounter: Payer: Self-pay | Admitting: Obstetrics and Gynecology

## 2020-08-25 LAB — CERVICOVAGINAL ANCILLARY ONLY
Chlamydia: NEGATIVE
Comment: NEGATIVE
Comment: NORMAL
Neisseria Gonorrhea: NEGATIVE

## 2020-08-28 NOTE — Telephone Encounter (Signed)
Glad pt feeling better.

## 2020-08-28 NOTE — Telephone Encounter (Signed)
Per [facility] called pt to check how she was doing. Pt says she is finally doing a lot better. Says before seeing ABC pain would come after 15 mins or so of walking. Says she has some pain after hours of being up, so there has been a lot of improvement per pt. She says thank you for checking up on her.

## 2020-09-05 ENCOUNTER — Encounter: Payer: Self-pay | Admitting: Obstetrics and Gynecology

## 2020-09-06 ENCOUNTER — Ambulatory Visit: Payer: 59 | Admitting: Obstetrics and Gynecology

## 2020-09-28 ENCOUNTER — Encounter: Payer: Self-pay | Admitting: Obstetrics and Gynecology

## 2020-10-16 ENCOUNTER — Ambulatory Visit: Payer: 59 | Admitting: Oncology

## 2020-10-16 ENCOUNTER — Other Ambulatory Visit: Payer: 59

## 2021-06-08 ENCOUNTER — Ambulatory Visit (INDEPENDENT_AMBULATORY_CARE_PROVIDER_SITE_OTHER): Payer: 59 | Admitting: Obstetrics and Gynecology

## 2021-06-08 ENCOUNTER — Encounter: Payer: Self-pay | Admitting: Obstetrics and Gynecology

## 2021-06-08 ENCOUNTER — Other Ambulatory Visit (HOSPITAL_COMMUNITY)
Admission: RE | Admit: 2021-06-08 | Discharge: 2021-06-08 | Disposition: A | Discharge status: Home / Self Care | Payer: 59 | Source: Ambulatory Visit | Attending: Obstetrics and Gynecology | Admitting: Obstetrics and Gynecology

## 2021-06-08 ENCOUNTER — Other Ambulatory Visit: Payer: Self-pay

## 2021-06-08 VITALS — BP 120/80 | Ht 64.0 in | Wt 222.0 lb

## 2021-06-08 DIAGNOSIS — Z124 Encounter for screening for malignant neoplasm of cervix: Secondary | ICD-10-CM | POA: Insufficient documentation

## 2021-06-08 DIAGNOSIS — Z3169 Encounter for other general counseling and advice on procreation: Secondary | ICD-10-CM

## 2021-06-08 DIAGNOSIS — E282 Polycystic ovarian syndrome: Secondary | ICD-10-CM

## 2021-06-08 DIAGNOSIS — Z01419 Encounter for gynecological examination (general) (routine) without abnormal findings: Secondary | ICD-10-CM

## 2021-06-08 DIAGNOSIS — Z1151 Encounter for screening for human papillomavirus (HPV): Secondary | ICD-10-CM | POA: Insufficient documentation

## 2021-06-08 DIAGNOSIS — N912 Amenorrhea, unspecified: Secondary | ICD-10-CM

## 2021-06-08 DIAGNOSIS — B977 Papillomavirus as the cause of diseases classified elsewhere: Secondary | ICD-10-CM | POA: Insufficient documentation

## 2021-06-08 DIAGNOSIS — B0089 Other herpesviral infection: Secondary | ICD-10-CM

## 2021-06-08 MED ORDER — VALACYCLOVIR HCL 1 G PO TABS: 500.0000 mg | ORAL_TABLET | Freq: Two times a day (BID) | ORAL | 0 refills | Status: AC

## 2021-06-08 MED ORDER — MEDROXYPROGESTERONE ACETATE 10 MG PO TABS: 10.0000 mg | ORAL_TABLET | Freq: Every day | ORAL | 0 refills | Status: AC

## 2021-06-08 NOTE — Patient Instructions (Signed)
I value your feedback and you entrusting us with your care. If you get a [facility] patient survey, I would appreciate you taking the time to let us know about your experience today. Thank you! ? ? ?

## 2021-06-08 NOTE — Progress Notes (Signed)
PCP:  System, Provider Not In   Chief Complaint  Patient presents with   Gynecologic Exam    Has questions about [facility]     HPI:      [name] is a 40 y.o. No obstetric history on file. who LMP was No LMP recorded. (Menstrual status: Irregular Periods)., presents today for her annual examination.  Her menses are absent off OCPs. Hx of PCOS and amenorrhea off pills in past; treated with provera in distant past. Pt would like to conceive. Stopped pills 10/21 and no period since. Menses were Q3 months on cont dosing OCPs, lasting 4-5 days.  Dysmenorrhea none. Pt takes lots of vits post bariatric surg, including folic acid.  Had episode of menorrhagia on OCPs last yr with 3 small leio on u/s. 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 Sx improved with tx.  Menorrhagia/BTB on OCPs alst yr with 3 small leio on u/s.   Has complicated med hx. Pt with hx of hiatal hernia with severe painful attacks. Is considering hernia surgery and bariatric surgery f/u at same time but having trouble getting insurance approval. Pt would then have to wait 12-18 months before conception.   Sex activity: currently sexually active. Contraception: none, wants to conceive. 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 Jerene Pitch with [facility]. 3/20 pap was neg cells/POS HPV DNA.  Repeat pap due this yr.  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 There is no FH of ovarian cancer. The patient does self-breast exams.  Tobacco use: The patient denies current or previous tobacco use. Alcohol use: none No drug use.  Exercise: min active  She does get adequate calcium and Vitamin D in her diet.   Diagnosed with non-specific auto immune disorder, doing plaquenil.   Hx of anxiety/depression. Was seeing psych and therapist due to family trauma.    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; no genital sx   Major depression    Polycystic ovaries     Past Surgical History:  Procedure Laterality Date   BARIATRIC SURGERY     CHOLECYSTECTOMY, LAPAROSCOPIC     COLPOSCOPY      Family History  Problem Relation Age of Onset   Diabetes Mother        type 2   Hypertension Mother    Bladder Cancer Mother    Heart disease Father    Diabetes Father        type 2   Hypertension Father    Lymphoma Maternal Grandfather     Social History   Socioeconomic History   Marital status: Married    Spouse name: Not on file   Number of children: Not on file   Years of education: Not on file   Highest education level: Not on file  Occupational History   Not on file  Tobacco Use   Smoking status: Never   Smokeless tobacco: Never  Vaping Use   Vaping Use: Never used  Substance and Sexual Activity   Alcohol use: No   Drug use: No   Sexual activity: Yes    Birth control/protection: None  Other Topics Concern   Not on file  Social History Narrative   Not on file   Social Determinants of Health   Financial Resource Strain: Not on file  Food Insecurity: Not on file  Transportation Needs: Not on file  Physical Activity: Not on file  Stress: Not on file  Social Connections: Not on file  Intimate Partner Violence: Not on file    Current Meds  Medication Sig   B Complex Vitamins (VITAMIN B COMPLEX PO) Take by mouth.   buPROPion (WELLBUTRIN XL) 300 MG 24 hr tablet Take 1 tablet by mouth daily.   cetirizine (ZYRTEC) 10 MG tablet Take 1 tablet by mouth daily.   folic acid (FOLVITE) 400 MCG tablet Take by mouth.   hydroxychloroquine (PLAQUENIL) 200 MG tablet Take 200 mg by mouth 2 (two) times daily.   medroxyPROGESTERone (PROVERA) 10 MG tablet Take 1 tablet (10 mg total) by mouth daily. For 7 days, Q90 days prn   Melatonin 10 MG TABS Take 1  tablet by mouth daily as needed.   Multiple Vitamin (MULTI-VITAMINS) TABS Take by mouth.   [DISCONTINUED] ASHLYNA 0.15-0.03 &0.01 MG tablet TAKE 1 TABLET BY MOUTH DAILY.*ASHLYNA ONLY*   [DISCONTINUED] valACYclovir (VALTREX) 1000 MG tablet Take 0.5 tablets (500 mg total) by mouth 2 (two) times daily. X 3 days prn sx     ROS:  Review of Systems  Constitutional:  Negative for fatigue, fever and unexpected weight change.  Respiratory:  Negative for cough, shortness of breath and wheezing.   Cardiovascular:  Negative for chest pain, palpitations and leg swelling.  Gastrointestinal:  Negative for blood in stool, constipation, diarrhea, nausea and vomiting.  Endocrine: Negative for cold intolerance, heat intolerance and polyuria.  Genitourinary:  Negative for dyspareunia, dysuria, flank pain, frequency, genital sores, hematuria, menstrual problem, pelvic pain, urgency, vaginal bleeding, vaginal discharge and vaginal pain.  Musculoskeletal:  Positive for arthralgias. Negative for back pain, joint swelling and myalgias.  Skin:  Negative for rash.  Neurological:  Negative for dizziness, syncope, light-headedness, numbness and headaches.  Hematological:  Negative for adenopathy.  Psychiatric/Behavioral:  Positive for agitation. Negative for confusion, dysphoric mood, sleep disturbance and suicidal ideas. The patient is not nervous/anxious.     Objective: BP 120/80   Ht 5\' 4"  (1.626 m)   Wt 222 lb (100.7 kg)   BMI 38.11 kg/m    Physical Exam Constitutional:      Appearance: She is well-developed.  Genitourinary:     Vulva normal.     Right Labia: No rash, tenderness or lesions.    Left Labia: No tenderness, lesions or rash.    No vaginal discharge, erythema or tenderness.      Right Adnexa: not tender and no mass present.    Left Adnexa: not tender and no mass present.    No cervical motion tenderness, friability or polyp.     Uterus is not enlarged or tender.  Breasts:    Right: No  mass, nipple discharge, skin change or tenderness.     Left: No mass, nipple discharge, skin change or tenderness.  Neck:     Thyroid: No thyromegaly.  Cardiovascular:     Rate and Rhythm: Normal rate and regular rhythm.     Heart sounds: Normal heart sounds. No murmur heard. Pulmonary:     Effort: Pulmonary effort is normal.     Breath sounds: Normal breath sounds.  Abdominal:     Palpations: Abdomen is soft.     Tenderness: There is no abdominal tenderness. There is no guarding or rebound.  Musculoskeletal:        General: Normal range of motion.     Cervical back: Normal range of motion.  Lymphadenopathy:     Cervical: No cervical adenopathy.  Neurological:     General: No focal deficit present.     Mental Status: She is alert and oriented to person, place, and time.     Cranial Nerves: No cranial nerve deficit.  Skin:    General: Skin is warm and dry.  Psychiatric:        Mood and Affect: Mood normal.        Behavior: Behavior normal.        Thought Content: Thought content normal.        Judgment: Judgment normal.  Vitals reviewed.   Assessment/Plan: Encounter for annual routine gynecological examination  Cervical cancer screening - Plan: Cytology - PAP  Screening for HPV (human papillomavirus) - Plan: Cytology - PAP  HPV (human papilloma virus) infection - Plan: Cytology - PAP; repeat pap today. Will f/u with results.   Amenorrhea - Plan: medroxyPROGESTERone (PROVERA) 10 MG tablet; Rx provera after neg first AM UPT. Hx of PCOS/amenorrhea. Discussed need for menses Q90 days. F/u if no withdrawal bleed.  PCOS (polycystic ovarian syndrome)--pt wants to conceive. Will need to f/u with MD for fertility mgmt if no conception happens.   Pre-conception counseling--cont vits. Pt will be high risk OB due to bariatric surg. Will need to discuss with MD.   Herpetic whitlow - On hands. Rx RF valtrex prn. - Plan: valACYclovir (VALTREX) 1000 MG tablet   Meds ordered this  encounter  Medications   medroxyPROGESTERone (PROVERA) 10 MG tablet    Sig: Take 1 tablet (10 mg total) by mouth daily. For 7 days, Q90 days prn    Dispense:  28 tablet    Refill:  0    Order Specific Question:   Supervising Provider    Answer:   Nadara Mustard [[id number]]   valACYclovir (VALTREX) 1000 MG tablet    Sig: Take 0.5 tablets (500 mg total) by mouth 2 (two) times daily. X 3 days prn sx    Dispense:  30 tablet    Refill:  0    Order Specific Question:   Supervising Provider    Answer:   Nadara Mustard [[id number]]              GYN counsel breast self exam, adequate intake of calcium and vitamin D, diet and exercise     F/U  Return in about 1 year (around 06/08/2022).  Hildur Bayer B. Jahmad Petrich, PA-C 06/08/2021 4:06 PM

## 2021-06-15 LAB — CYTOLOGY - PAP
Comment: NEGATIVE
Diagnosis: NEGATIVE
High risk HPV: NEGATIVE

## 2021-06-30 ENCOUNTER — Other Ambulatory Visit: Payer: Self-pay | Admitting: Obstetrics and Gynecology

## 2021-06-30 DIAGNOSIS — N912 Amenorrhea, unspecified: Secondary | ICD-10-CM

## 2021-09-26 ENCOUNTER — Encounter: Payer: Self-pay | Admitting: Obstetrics and Gynecology

## 2021-09-26 MED ORDER — NITROFURANTOIN MONOHYD MACRO 100 MG PO CAPS: 100.0000 mg | ORAL_CAPSULE | Freq: Two times a day (BID) | ORAL | 0 refills | Status: AC

## 2021-12-03 IMAGING — US US ABDOMEN COMPLETE W/ ELASTOGRAPHY
2 of 3 series · 12 of 25 positions shown · non-contrast
Comparison: None.

CLINICAL DATA: Elevated liver enzymes.  Prior cholecystectomy.

EXAM:
ULTRASOUND ABDOMEN
ULTRASOUND HEPATIC ELASTOGRAPHY
TECHNIQUE: Sonography of the upper abdomen was performed. In addition,
ultrasound elastography evaluation of the liver was performed. A
region of interest was placed within the right lobe of the liver.
Following application of a compressive sonographic pulse, tissue
compressibility was assessed. Multiple assessments were performed at
the selected site. Median tissue compressibility was determined.
Previously, hepatic stiffness was assessed by shear wave velocity.
Based on recently published Society of Radiologists in Ultrasound
consensus article, reporting is now recommended to be performed in
the SI units of pressure (kiloPascals) representing hepatic
stiffness/elasticity. The obtained result is compared to the
published reference standards. (cACLD= compensated Advanced Chronic
Liver Disease)

[Series 1: us abdomen complete w/ elastography · 10 of 74 slices shown (1 of 2)]
[im 4/74]
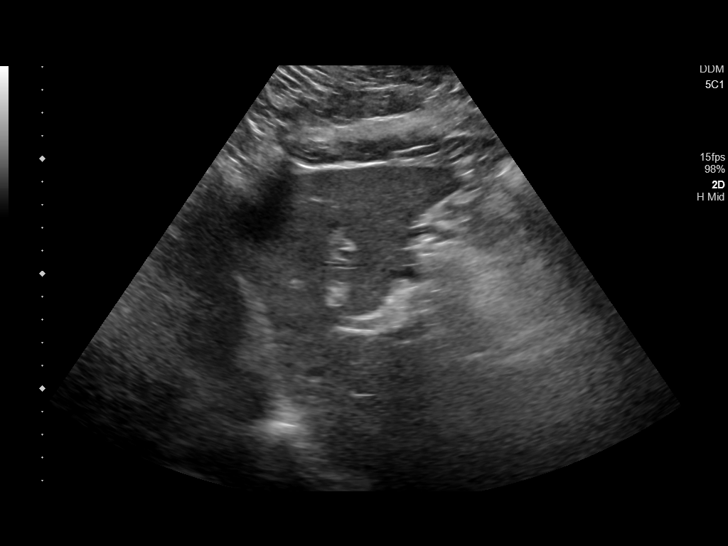
[im 12/74]
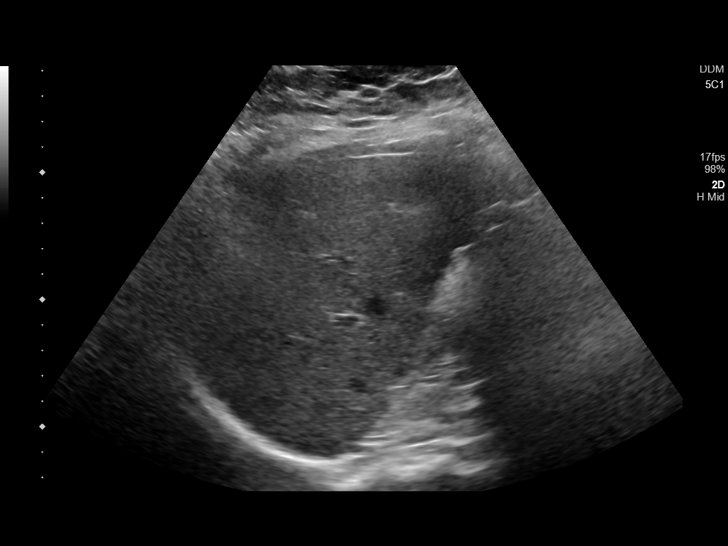
[im 20/74]
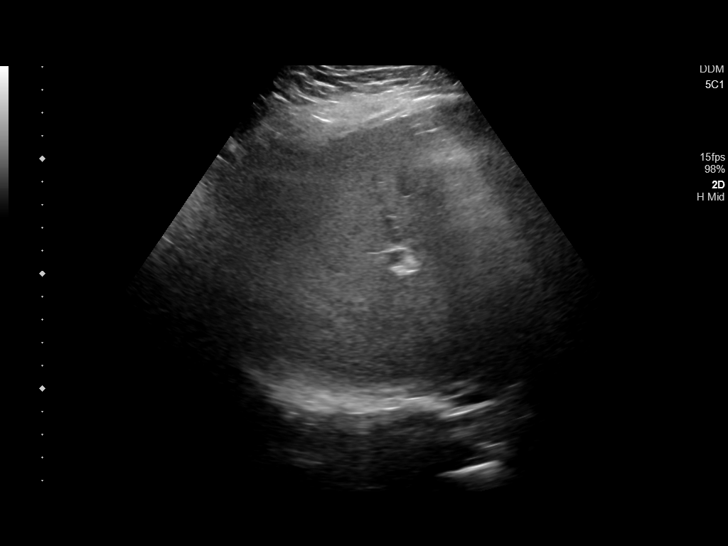
[im 27/74]
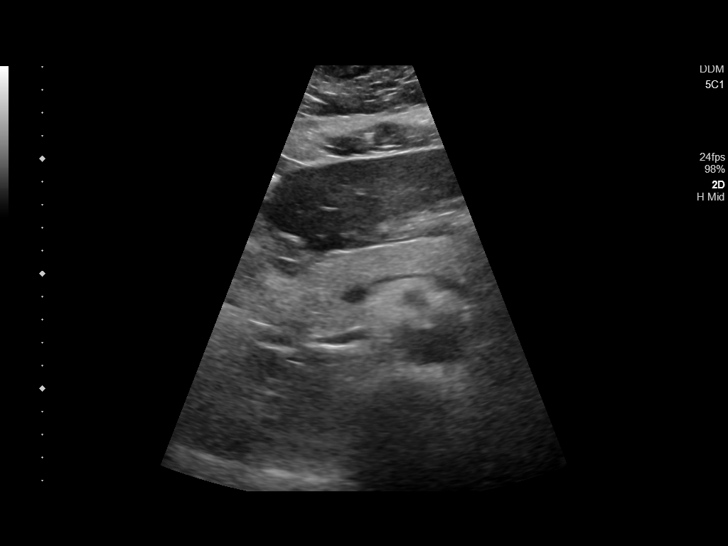
[im 35/74]
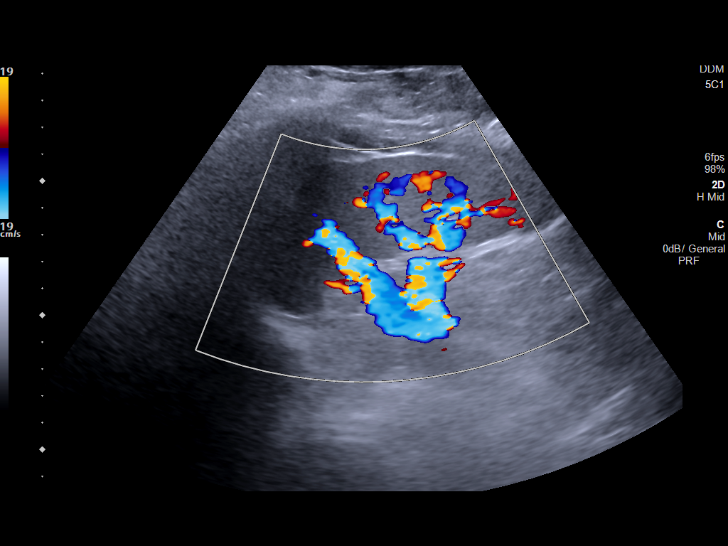
[im 43/74]
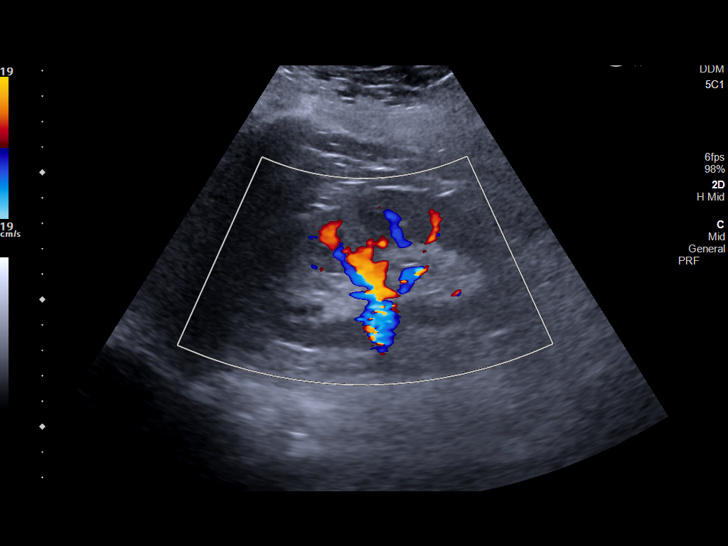
[im 50/74]
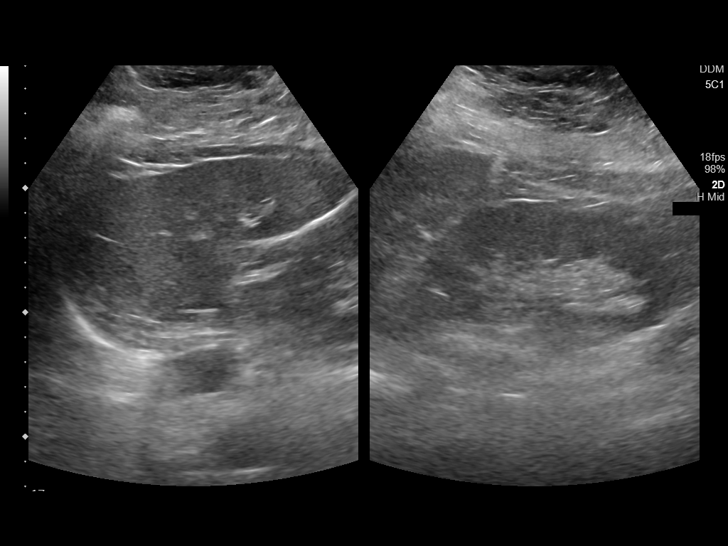
[im 58/74]
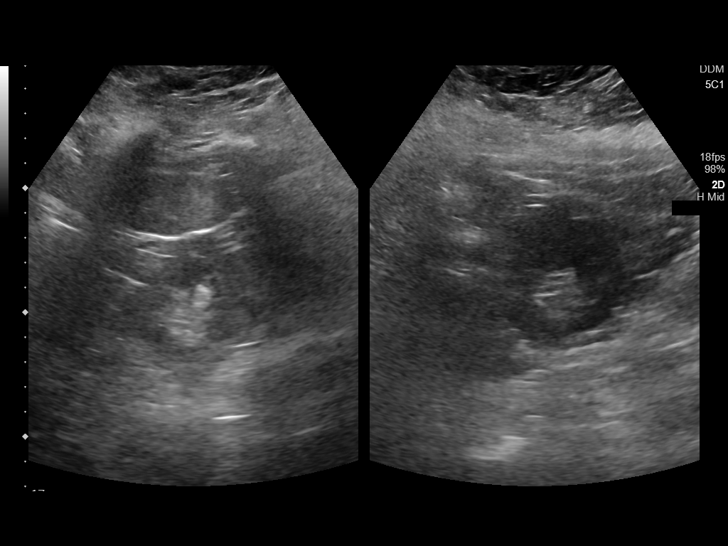
[im 66/74]
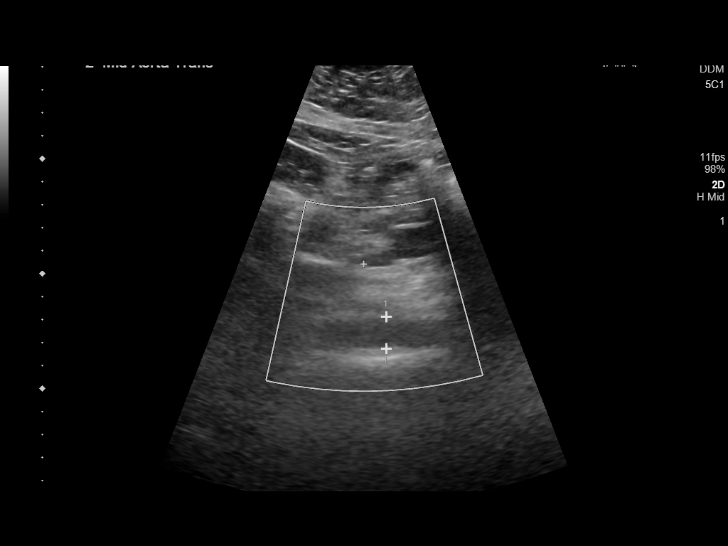
[im 74/74]
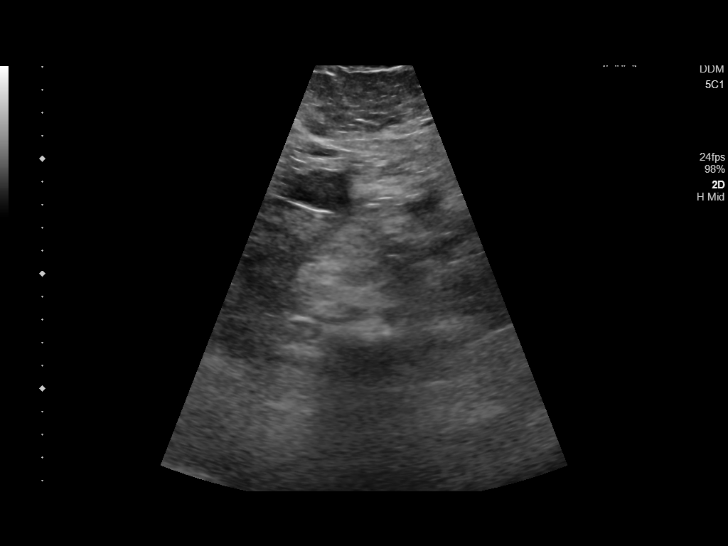

[Series 1001: us abdomen complete w/ elastography · 2 of 14 slices shown (2 of 2)]
[im 5/14]
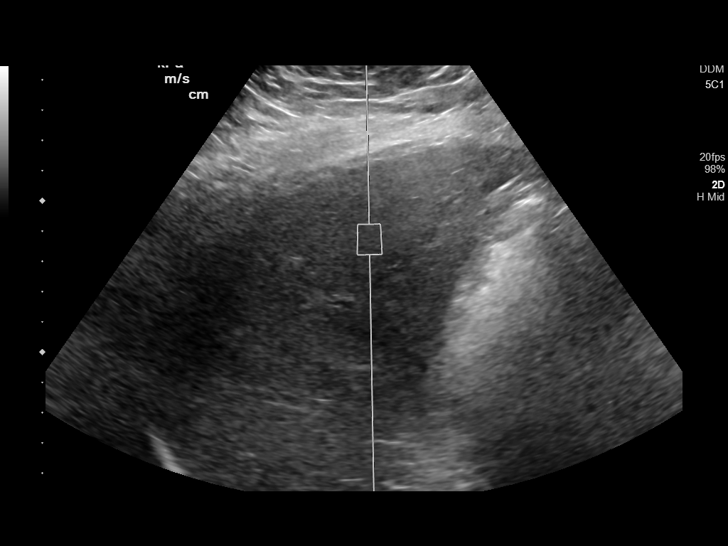
[im 14/14]
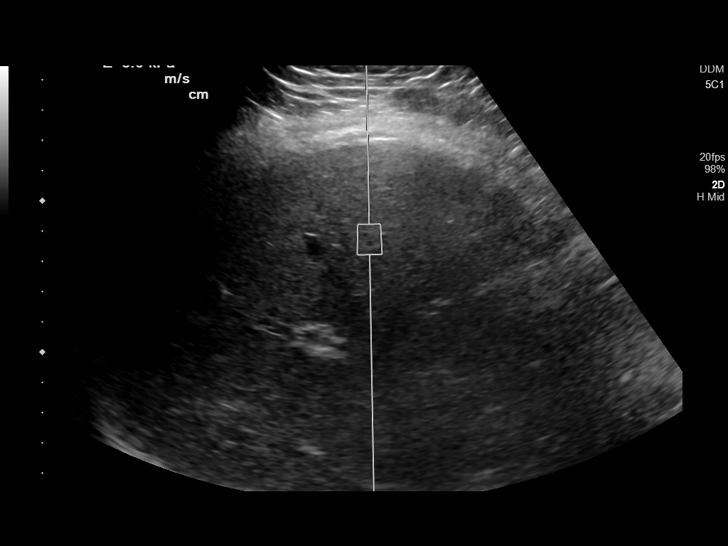

[12 of 25 positions shown; findings below may reference images not displayed]

FINDINGS: ULTRASOUND ABDOMEN

Gallbladder: Surgically absent.

Common bile duct: Diameter: 3 mm, within normal limits.

Liver: No focal lesion identified. Within normal limits in
parenchymal echogenicity. Portal vein is patent on color Doppler
imaging with normal direction of blood flow towards the liver.

IVC: No abnormality visualized.

Pancreas: Visualized portion unremarkable.

Spleen: Size and appearance within normal limits.

Right Kidney: Length: 10.8 cm. Echogenicity within normal limits. No
mass or hydronephrosis visualized.

Left Kidney: Length: 10.7 cm. Echogenicity within normal limits. No
mass or hydronephrosis visualized.

Abdominal aorta: No aneurysm visualized.

Other findings: None.

ULTRASOUND HEPATIC ELASTOGRAPHY

Device: Siemens Helix VTQ

Patient position: Supine

Transducer 5C1

Number of measurements: 10

Hepatic segment:  8

Median kPa:

IQR:

IQR/Median kPa ratio:

Data quality:  Good

Diagnostic category: ?9 kPa: in the absence of other known clinical
signs, rules out cACLD
IMPRESSION: ULTRASOUND ABDOMEN:

Prior cholecystectomy. No hepatobiliary or other significant
abnormality identified.

ULTRASOUND HEPATIC ELASTOGRAPHY:

Median kPa:

Diagnostic category: ?9 kPa: in the absence of other known clinical
signs, rules out cACLD

The use of hepatic elastography is applicable to patients with viral
hepatitis and non-alcoholic fatty liver disease. At this time, there
is insufficient data for the referenced cut-off values and use in
other causes of liver disease, including alcoholic liver disease.
Patients, however, may be assessed by elastography and serve as
their own reference standard/baseline.

In patients with non-alcoholic liver disease, the values suggesting
compensated advanced chronic liver disease (cACLD) may be lower, and
patients may need additional testing with elasticity results of [DATE]
kPa.

Please note that abnormal hepatic elasticity and shear wave
velocities may also be identified in clinical settings other than
with hepatic fibrosis, such as: acute hepatitis, elevated right
heart and central venous pressures including use of beta blockers,
Ivory disease (Delvis), infiltrative processes such as
mastocytosis/amyloidosis/infiltrative tumor/lymphoma, extrahepatic
cholestasis, with hyperemia in the post-prandial state, and with
liver transplantation. Correlation with patient history, laboratory
data, and clinical condition recommended.

Diagnostic Categories:

?5 kPa: high probability of being normal

?9 kPa: in the absence of other known clinical signs, rules [DATE] kPa and ?13 kPa: suggestive of cACLD, but needs further testing

>13 kPa: highly suggestive of cACLD

?17 kPa: highly suggestive of cACLD with an increased probability of
clinically significant portal hypertension
